# Patient Record
Sex: Female | Born: 1960 | Race: White | Hispanic: No | Marital: Married | Smoking: Never smoker
Health system: Southern US, Community
[De-identification: ages and names within clinical notes are randomized; demographics above are authoritative.]

## PROBLEM LIST (undated history)

## (undated) DIAGNOSIS — N763 Subacute and chronic vulvitis: Secondary | ICD-10-CM

## (undated) DIAGNOSIS — Z9889 Other specified postprocedural states: Secondary | ICD-10-CM

## (undated) DIAGNOSIS — E063 Autoimmune thyroiditis: Secondary | ICD-10-CM

## (undated) DIAGNOSIS — N6019 Diffuse cystic mastopathy of unspecified breast: Secondary | ICD-10-CM

## (undated) DIAGNOSIS — A630 Anogenital (venereal) warts: Secondary | ICD-10-CM

## (undated) DIAGNOSIS — D069 Carcinoma in situ of cervix, unspecified: Secondary | ICD-10-CM

## (undated) DIAGNOSIS — E039 Hypothyroidism, unspecified: Secondary | ICD-10-CM

## (undated) HISTORY — DX: Anogenital (venereal) warts: A63.0

## (undated) HISTORY — DX: Subacute and chronic vulvitis: N76.3

## (undated) HISTORY — DX: Diffuse cystic mastopathy of unspecified breast: N60.19

## (undated) HISTORY — DX: Autoimmune thyroiditis: E06.3

## (undated) HISTORY — DX: Carcinoma in situ of cervix, unspecified: D06.9

---
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
## 1990-08-17 DIAGNOSIS — N763 Subacute and chronic vulvitis: Secondary | ICD-10-CM

## 1990-08-17 HISTORY — DX: Subacute and chronic vulvitis: N76.3

## 1991-10-18 DIAGNOSIS — N6019 Diffuse cystic mastopathy of unspecified breast: Secondary | ICD-10-CM

## 1991-10-18 HISTORY — DX: Diffuse cystic mastopathy of unspecified breast: N60.19

## 1992-10-17 DIAGNOSIS — A630 Anogenital (venereal) warts: Secondary | ICD-10-CM

## 1992-10-17 HISTORY — DX: Anogenital (venereal) warts: A63.0

## 1994-11-17 HISTORY — PX: KNEE RECONSTRUCTION: SHX5883

## 1998-12-18 HISTORY — PX: COLPOSCOPY: SHX161

## 1999-01-11 ENCOUNTER — Other Ambulatory Visit: Admission: RE | Admit: 1999-01-11 | Discharge: 1999-01-11 | Payer: Self-pay | Admitting: *Deleted

## 1999-01-16 DIAGNOSIS — D069 Carcinoma in situ of cervix, unspecified: Secondary | ICD-10-CM

## 1999-01-16 HISTORY — PX: CERVICAL BIOPSY  W/ LOOP ELECTRODE EXCISION: SUR135

## 1999-01-16 HISTORY — DX: Carcinoma in situ of cervix, unspecified: D06.9

## 1999-01-30 ENCOUNTER — Other Ambulatory Visit: Admission: RE | Admit: 1999-01-30 | Discharge: 1999-01-30 | Payer: Self-pay | Admitting: *Deleted

## 1999-05-31 ENCOUNTER — Other Ambulatory Visit: Admission: RE | Admit: 1999-05-31 | Discharge: 1999-05-31 | Payer: Self-pay | Admitting: *Deleted

## 1999-05-31 ENCOUNTER — Encounter (INDEPENDENT_AMBULATORY_CARE_PROVIDER_SITE_OTHER): Payer: Self-pay

## 1999-09-18 ENCOUNTER — Other Ambulatory Visit: Admission: RE | Admit: 1999-09-18 | Discharge: 1999-09-18 | Payer: Self-pay | Admitting: *Deleted

## 2000-03-30 ENCOUNTER — Other Ambulatory Visit: Admission: RE | Admit: 2000-03-30 | Discharge: 2000-03-30 | Payer: Self-pay | Admitting: *Deleted

## 2000-09-04 ENCOUNTER — Other Ambulatory Visit: Admission: RE | Admit: 2000-09-04 | Discharge: 2000-09-04 | Payer: Self-pay | Admitting: *Deleted

## 2000-11-17 HISTORY — PX: SHOULDER ARTHROSCOPY: SHX128

## 2001-01-15 ENCOUNTER — Ambulatory Visit (HOSPITAL_BASED_OUTPATIENT_CLINIC_OR_DEPARTMENT_OTHER): Admission: RE | Admit: 2001-01-15 | Discharge: 2001-01-15 | Payer: Self-pay | Admitting: Orthopedic Surgery

## 2001-03-04 ENCOUNTER — Other Ambulatory Visit: Admission: RE | Admit: 2001-03-04 | Discharge: 2001-03-04 | Payer: Self-pay | Admitting: *Deleted

## 2001-09-06 ENCOUNTER — Other Ambulatory Visit: Admission: RE | Admit: 2001-09-06 | Discharge: 2001-09-06 | Payer: Self-pay | Admitting: *Deleted

## 2002-03-30 ENCOUNTER — Other Ambulatory Visit: Admission: RE | Admit: 2002-03-30 | Discharge: 2002-03-30 | Payer: Self-pay | Admitting: *Deleted

## 2002-10-03 ENCOUNTER — Other Ambulatory Visit: Admission: RE | Admit: 2002-10-03 | Discharge: 2002-10-03 | Payer: Self-pay | Admitting: *Deleted

## 2003-03-20 ENCOUNTER — Other Ambulatory Visit: Admission: RE | Admit: 2003-03-20 | Discharge: 2003-03-20 | Payer: Self-pay | Admitting: *Deleted

## 2003-10-05 ENCOUNTER — Other Ambulatory Visit: Admission: RE | Admit: 2003-10-05 | Discharge: 2003-10-05 | Payer: Self-pay | Admitting: *Deleted

## 2004-04-08 ENCOUNTER — Other Ambulatory Visit: Admission: RE | Admit: 2004-04-08 | Discharge: 2004-04-08 | Payer: Self-pay | Admitting: *Deleted

## 2004-09-19 ENCOUNTER — Other Ambulatory Visit: Admission: RE | Admit: 2004-09-19 | Discharge: 2004-09-19 | Payer: Self-pay | Admitting: *Deleted

## 2005-09-24 ENCOUNTER — Other Ambulatory Visit: Admission: RE | Admit: 2005-09-24 | Discharge: 2005-09-24 | Payer: Self-pay | Admitting: Obstetrics and Gynecology

## 2006-09-25 ENCOUNTER — Other Ambulatory Visit: Admission: RE | Admit: 2006-09-25 | Discharge: 2006-09-25 | Payer: Self-pay | Admitting: Obstetrics & Gynecology

## 2006-12-18 HISTORY — PX: KNEE ARTHROSCOPY: SHX127

## 2007-11-30 ENCOUNTER — Other Ambulatory Visit: Admission: RE | Admit: 2007-11-30 | Discharge: 2007-11-30 | Payer: Self-pay | Admitting: Obstetrics and Gynecology

## 2008-11-30 ENCOUNTER — Other Ambulatory Visit: Admission: RE | Admit: 2008-11-30 | Discharge: 2008-11-30 | Payer: Self-pay | Admitting: Obstetrics and Gynecology

## 2009-06-17 HISTORY — PX: REPAIR PERONEAL TENDONS ANKLE: SUR1201

## 2009-09-03 ENCOUNTER — Encounter: Admission: RE | Admit: 2009-09-03 | Discharge: 2009-09-03 | Payer: Self-pay | Admitting: Family Medicine

## 2009-10-02 ENCOUNTER — Encounter: Payer: Self-pay | Admitting: Sports Medicine

## 2009-10-16 ENCOUNTER — Ambulatory Visit: Payer: Self-pay | Admitting: Sports Medicine

## 2009-10-16 DIAGNOSIS — M7750 Other enthesopathy of unspecified foot: Secondary | ICD-10-CM | POA: Insufficient documentation

## 2009-10-16 DIAGNOSIS — M216X9 Other acquired deformities of unspecified foot: Secondary | ICD-10-CM

## 2009-10-16 DIAGNOSIS — M25579 Pain in unspecified ankle and joints of unspecified foot: Secondary | ICD-10-CM

## 2009-10-16 DIAGNOSIS — R269 Unspecified abnormalities of gait and mobility: Secondary | ICD-10-CM

## 2009-10-18 ENCOUNTER — Encounter: Payer: Self-pay | Admitting: Family Medicine

## 2010-05-17 HISTORY — PX: KNEE ARTHROSCOPY: SHX127

## 2010-06-11 IMAGING — CR DG SHOULDER 1V BILAT
2 series · 2 of 2 positions shown · non-contrast
Comparison: None

CLINICAL DATA: Research study.

BILATERAL SHOULDER - 1 VIEW

[view not recorded (1 of 2)]
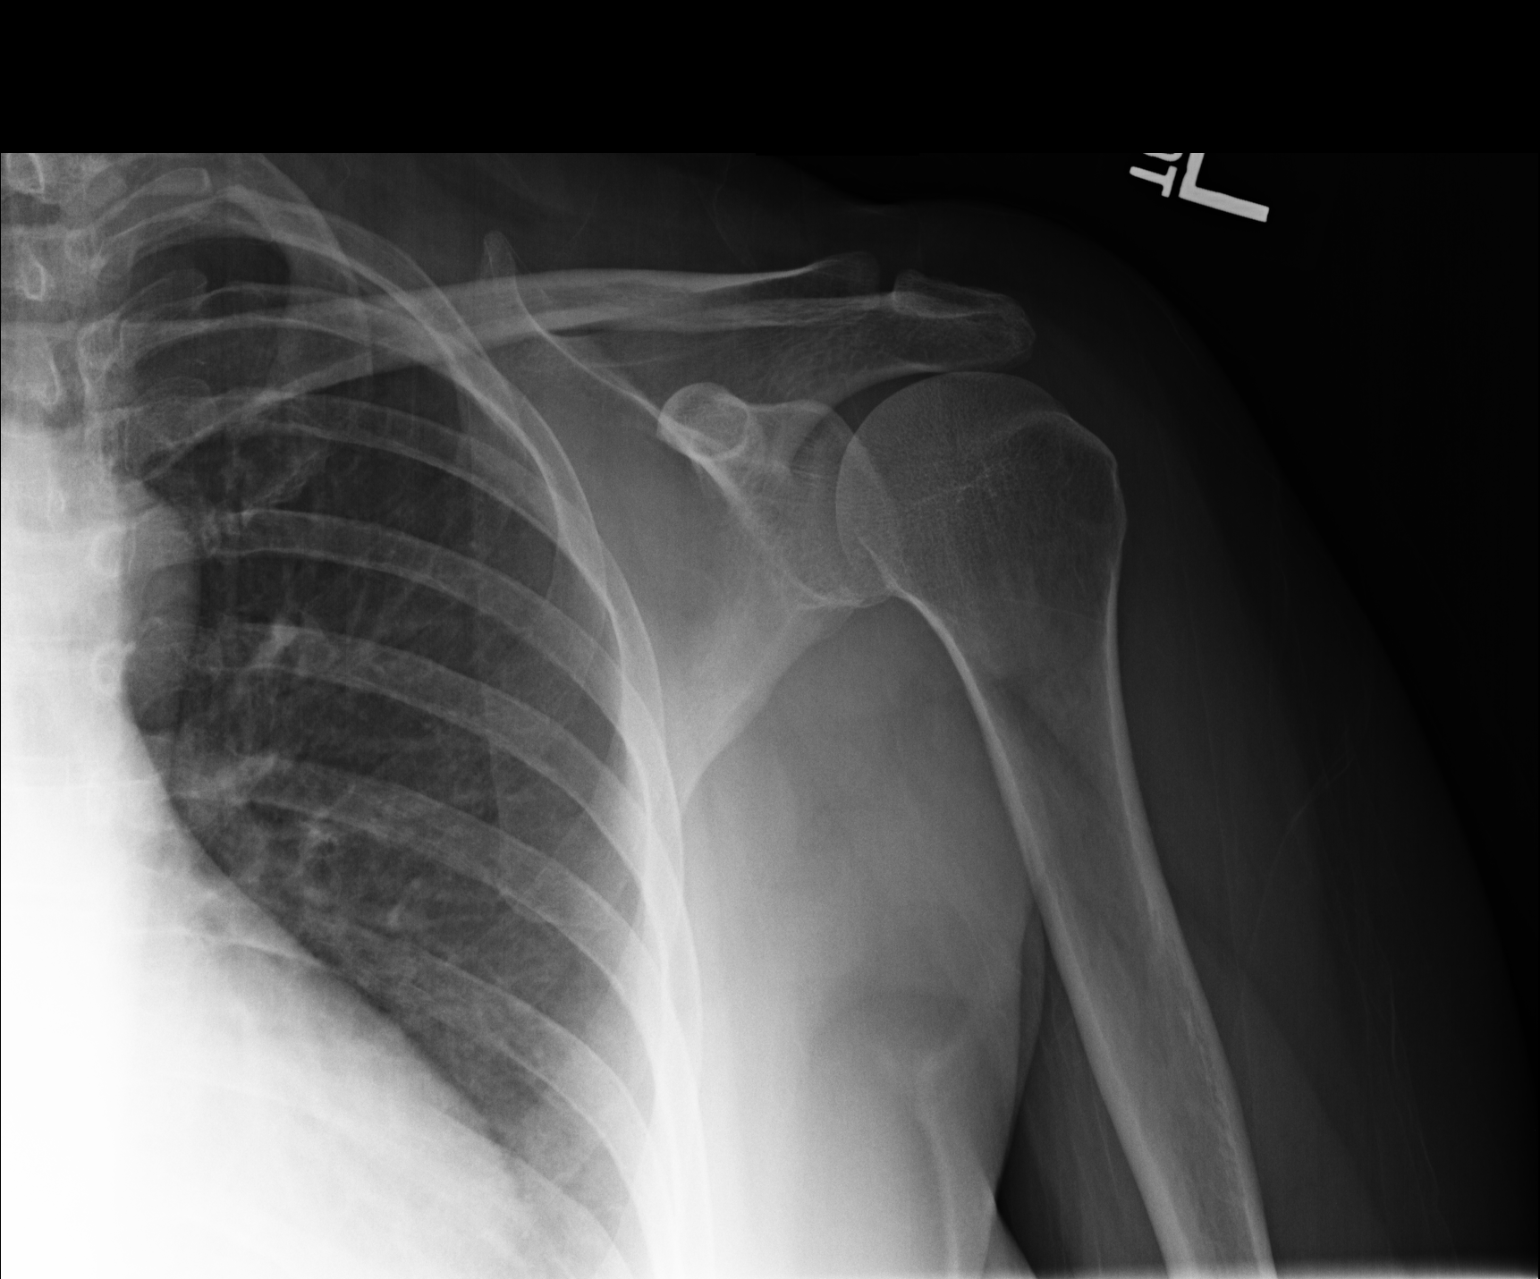

[view not recorded (2 of 2)]
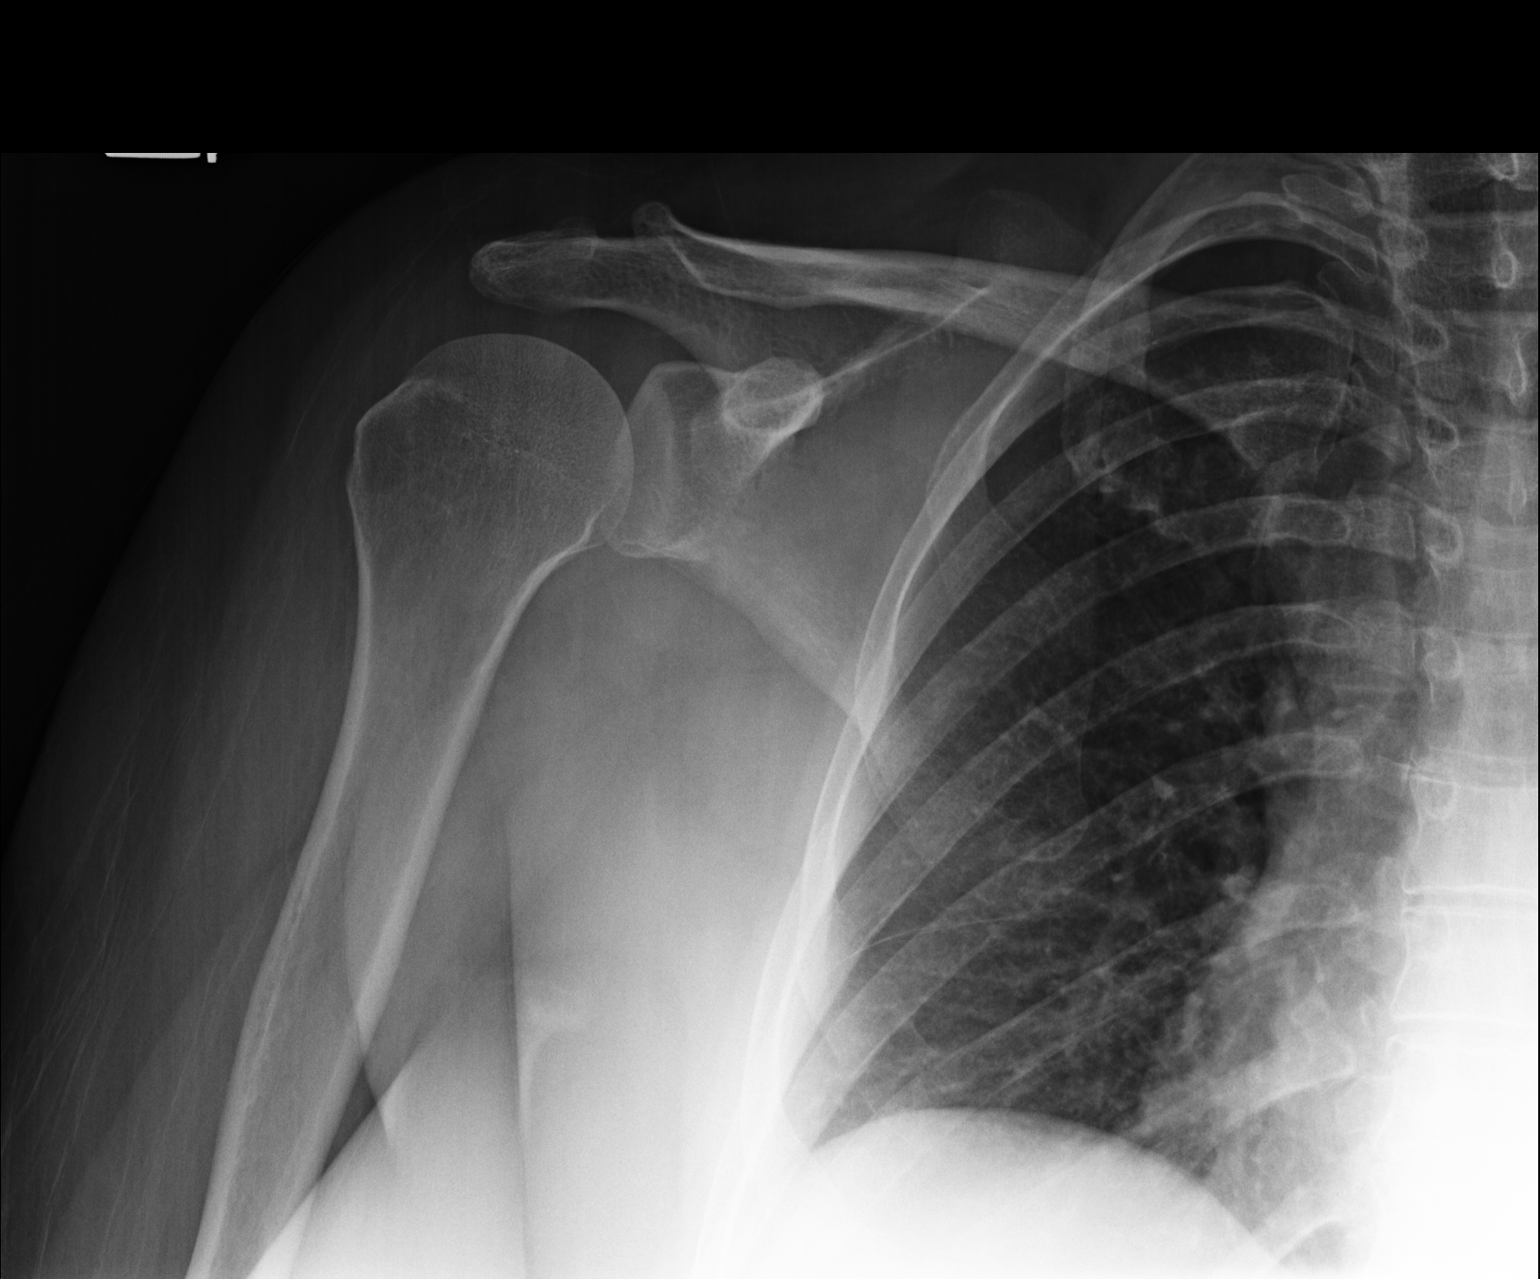

[2 of 2 positions shown; findings below may reference images not displayed]

FINDINGS: There is mild widening of the right AC joint relative to
the left.  Cannot completely exclude AC joint separation.
Recommend clinical correlation for injury and pain.  No fracture.
No significant degenerative changes.
IMPRESSION: Mild widening of the right AC joint relative to the left.
Recommend clinical correlation for possible AC joint separation.

## 2011-04-04 NOTE — Op Note (Signed)
[place]  Patient:    Yolanda Vaughn, Yolanda Vaughn                          MRN: [id number] Proc. Date: 01/15/01 Adm. Date:  [id number] Attending:  Colbert Ewing                           Operative Report  PREOPERATIVE DIAGNOSIS:  Chronic impingement, right shoulder.  POSTOPERATIVE DIAGNOSES: 1. Chronic impingement, right shoulder, with degenerative arthritis and    impingement at distal clavicle. 2. Abrasive tearing, superior rotator cuff.  PROCEDURES: 1. Right shoulder examination under anesthesia, arthroscopy, debridement of    rotator cuff. 2. Acromioplasty. 3. Coracoacromial ligament release. 4. Excision distal clavicle.  SURGEON:  Loreta Ave, M.D.  ASSISTANT:  Arlys John D. Petrarca, P.A.-C.  ANESTHESIA:  General.  ESTIMATED BLOOD LOSS:  Minimal.  SPECIMENS:  None.  CULTURES:  None.  COMPLICATIONS:  None.  DRESSING:  Soft compressive with sling.  DESCRIPTION OF PROCEDURE:  Patient brought into the operating room and after adequate anesthesia had been obtained, right shoulder examined.  Full motion, good stability.  Placed in a beach chair position on the shoulder positioner, prepped and draped in the usual sterile fashion.  Three standard arthroscopic portals, anterior, posterior, lateral.  Shoulder entered with a blunt obturator, distended, and inspected.  The glenohumeral joint itself looked excellent without any evidence of instability.  Labrum, articular cartilage, biceps tendon, biceps anchor, rotator cuff all intact.  No evidence of internal impingement.  Cannula redirected subacromially.  Picture consistent with chronic impingement with abrasive tearing of the top of the cuff.  Type 2 acromion with marked anterior narrowing.  Bursa resected, cuff debrided. Acromion converted to a type 1 acromion with shaver and high-speed bur. Distal clavicle was contributing to impingement before acromioplasty.  Bottom half of the  clavicle had grade 3 and 4 changes.  The top half had grade 2 and 3 changes.  Lateral centimeter therefore sharply resected.  Margins of the acromion at the [facility] joint also trimmed to a stable surface.  Decompression acromioplasty, clavicle excision viewed from all portals and was adequate. Cuff thoroughly inspected after being debrided.  No full-thickness tears. Instruments and fluid removed.  Portals, shoulder, and bursa injected with Marcaine.  Portals closed with 4-0 nylon.  Sterile compressive dressing applied.  Sling applied.  The anesthesia reversed, brought to recovery room. Tolerated the surgery well with no complications. DD:  01/15/01 TD:  01/16/01 Job: [id number]

## 2012-01-27 DIAGNOSIS — M179 Osteoarthritis of knee, unspecified: Secondary | ICD-10-CM | POA: Insufficient documentation

## 2013-08-17 LAB — HM COLONOSCOPY: HM Colonoscopy: NORMAL

## 2013-11-17 DIAGNOSIS — E063 Autoimmune thyroiditis: Secondary | ICD-10-CM

## 2013-11-17 HISTORY — DX: Autoimmune thyroiditis: E06.3

## 2014-01-06 ENCOUNTER — Encounter: Payer: Self-pay | Admitting: Nurse Practitioner

## 2014-01-09 ENCOUNTER — Ambulatory Visit (INDEPENDENT_AMBULATORY_CARE_PROVIDER_SITE_OTHER): Payer: Managed Care, Other (non HMO) | Admitting: Nurse Practitioner

## 2014-01-09 ENCOUNTER — Encounter: Payer: Self-pay | Admitting: Nurse Practitioner

## 2014-01-09 VITALS — BP 116/72 | HR 64 | Ht 66.5 in | Wt 255.0 lb

## 2014-01-09 DIAGNOSIS — Z01419 Encounter for gynecological examination (general) (routine) without abnormal findings: Secondary | ICD-10-CM

## 2014-01-09 DIAGNOSIS — I10 Essential (primary) hypertension: Secondary | ICD-10-CM

## 2014-01-09 DIAGNOSIS — E039 Hypothyroidism, unspecified: Secondary | ICD-10-CM | POA: Insufficient documentation

## 2014-01-09 DIAGNOSIS — Z0000 Encounter for general adult medical examination without abnormal findings: Secondary | ICD-10-CM

## 2014-01-09 DIAGNOSIS — Z8742 Personal history of other diseases of the female genital tract: Secondary | ICD-10-CM

## 2014-01-09 DIAGNOSIS — E669 Obesity, unspecified: Secondary | ICD-10-CM

## 2014-01-09 LAB — POCT URINALYSIS DIPSTICK
BILIRUBIN UA: NEGATIVE
Blood, UA: NEGATIVE
Glucose, UA: NEGATIVE
KETONES UA: NEGATIVE
LEUKOCYTES UA: NEGATIVE
Nitrite, UA: NEGATIVE
PH UA: 5
Protein, UA: NEGATIVE
UROBILINOGEN UA: NEGATIVE

## 2014-01-09 LAB — HEMOGLOBIN, FINGERSTICK: Hemoglobin, fingerstick: 13.4 g/dL (ref 12.0–16.0)

## 2014-01-09 MED ORDER — ESTRADIOL 10 MCG VA TABS: 1.0000 | ORAL_TABLET | VAGINAL | Status: DC

## 2014-01-09 MED ORDER — NORETHINDRONE 0.35 MG PO TABS: 1.0000 | ORAL_TABLET | Freq: Every day | ORAL | Status: DC

## 2014-01-09 NOTE — Progress Notes (Signed)
Patient ID: Yolanda Vaughn, female   DOB: [birth year], 53 y.o.   MRN: [id number] 53 y.o. G0P0 Married Caucasian Fe here for annual exam.  Has amenorrhea since mid October. No PMS symptoms. No spotting. Some increase in vaso symptoms. At times vaginal dryness is better with Vagifem.   Patient's last menstrual period was 09/05/2013.          Sexually active: yes  The current method of family planning is oral progesterone-only contraceptive.    Exercising: yes  bike and weights Smoker:  no  Health Maintenance: Pap:  01/06/13, WNL, neg HR HPV MMG:  2013  Colonoscopy:  08/2013, normal, repeat in 10 years BMD:   never TDaP:  ? Labs: HB:  13.4  (routine labs at PCP in 07/2013) Urine:  Negative, pH 5.0   reports that she has never smoked. She has never used smokeless tobacco. She reports that she does not drink alcohol or use illicit drugs.  Past Medical History  Diagnosis Date  . Fibrocystic breast 10/1991  . Chronic vulvitis 08/1990  . 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 Knee reconstruction Right 1996  . Shoulder arthroscopy Right 2002  . Knee arthroscopy Right 12/2006  . Repair peroneal tendons ankle Left 06/2009  . Knee arthroscopy Right 05/2010  . Tonsillectomy and adenoidectomy  1968  . Cervical biopsy  w/ loop electrode excision  01/1999  . Colposcopy  12/1998    Current Outpatient Prescriptions  Medication Sig Dispense Refill  . albuterol (PROVENTIL HFA;VENTOLIN HFA) 108 (90 BASE) MCG/ACT inhaler Inhale 2 puffs into the lungs every 6 (six) hours as needed for wheezing or shortness of breath.      . Calcium Carbonate-Vit D-Min (CALCIUM 1200 PO) Take by mouth daily.      . Cholecalciferol (VITAMIN D) 2000 UNITS tablet Take 5,000 Units by mouth daily.       Marland Kitchen. Dexlansoprazole 30 MG capsule Take 30 mg by mouth daily.      . Estradiol (VAGIFEM)  10 MCG TABS vaginal tablet Place 1 tablet (10 mcg total) vaginally every 3 (three) months.  24 tablet  3  . fexofenadine (ALLEGRA) 180 MG tablet Take 180 mg by mouth daily.      . fluticasone (FLONASE) 50 MCG/ACT nasal spray as directed.      Marland Kitchen. levothyroxine (SYNTHROID, LEVOTHROID) 50 MCG tablet Take 100 mcg by mouth daily before breakfast.       . meloxicam (MOBIC) 15 MG tablet Take 15 mg by mouth daily.      . mometasone-formoterol (DULERA) 100-5 MCG/ACT AERO Inhale 2 puffs into the lungs 2 (two) times daily.      . norethindrone (MICRONOR,CAMILA,ERRIN) 0.35 MG tablet Take 1 tablet (0.35 mg total) by mouth daily.  3 Package  3  . pseudoephedrine (SUDAFED) 30 MG tablet Take 30 mg by mouth every 4 (four) hours as needed for congestion.      Marland Kitchen. QVAR 80 MCG/ACT inhaler as needed.      . RELPAX 40 MG tablet Take 1 tablet by mouth as needed. migraine       No current facility-administered medications for this visit.    Family History  Problem Relation Age of Onset  . Osteoporosis Mother   . Fibroids Mother   . Hypertension Mother   . Colitis Mother   . Hyperlipidemia Mother   . Thyroid disease  Mother   . Fibroids Sister   . Osteoporosis Maternal Grandmother   . Emphysema Maternal Grandfather   . Pulmonary disease Maternal Grandfather   . Dementia Father   . Thyroid disease Brother   . Hyperlipidemia Brother     ROS:  Pertinent items are noted in HPI.  Otherwise, a comprehensive ROS was negative.  Exam:   BP 116/72  Pulse 64  Ht 5' 6.5" (1.689 m)  Wt 255 lb (115.667 kg)  BMI 40.55 kg/m2  LMP 09/05/2013 Height: 5' 6.5" (168.9 cm)  Ht Readings from Last 3 Encounters:  01/09/14 5' 6.5" (1.689 m)    General appearance: alert, cooperative and appears stated age Head: Normocephalic, without obvious abnormality, atraumatic Neck: no adenopathy, supple, symmetrical, trachea midline and thyroid normal to inspection and palpation Lungs: clear to auscultation bilaterally Breasts: normal  appearance, no masses or tenderness Heart: regular rate and rhythm Abdomen: soft, non-tender; no masses,  no organomegaly Extremities: extremities normal, atraumatic, no cyanosis or edema Skin: Skin color, texture, turgor normal. No rashes or lesions Lymph nodes: Cervical, supraclavicular, and axillary nodes normal. No abnormal inguinal nodes palpated Neurologic: Grossly normal   Pelvic: External genitalia:  no lesions              Urethra:  normal appearing urethra with no masses, tenderness or lesions              Bartholin's and Skene's: normal                 Vagina: normal appearing vagina with normal color and discharge, no lesions              Cervix: anteverted              Pap taken: yes Bimanual Exam:  Uterus:  normal size, contour, position, consistency, mobility, non-tender              Adnexa: no mass, fullness, tenderness               Rectovaginal: Confirms               Anus:  normal sphincter tone, no lesions  A:  Well Woman with normal exam  POP for contraception since 12/2010, at risk for endo hyperplasia  Hypertension, obesity,  hypothyroid  History of CIN III 01/1999  P:   Pap smear as per guidelines Done today  Mammogram is due now and will schedule  Refill POP for a year  Refill Vagifem for a year - uses occasionally  Counseled on breast self exam, mammography screening, adequate intake of calcium and vitamin D, diet and exercise return annually or prn  An After Visit Summary was printed and given to the patient.

## 2014-01-09 NOTE — Patient Instructions (Signed)

## 2014-01-10 ENCOUNTER — Telehealth: Payer: Self-pay | Admitting: Nurse Practitioner

## 2014-01-10 NOTE — Telephone Encounter (Signed)
[facility] calling to verify the directions for vagifem

## 2014-01-11 LAB — IPS PAP TEST WITH REFLEX TO HPV

## 2014-01-12 NOTE — Progress Notes (Signed)
Encounter reviewed by Dr. Maurianna Benard Silva.  

## 2014-01-13 NOTE — Telephone Encounter (Signed)
Spoke with Dr. Farrel GobbleLathrop, directions are 1 tablet PV at bedtime two times per week. Spoke with pharmacist, Arm and confirmed order.

## 2014-05-16 DIAGNOSIS — M7742 Metatarsalgia, left foot: Secondary | ICD-10-CM | POA: Insufficient documentation

## 2014-05-16 DIAGNOSIS — M79672 Pain in left foot: Secondary | ICD-10-CM | POA: Insufficient documentation

## 2015-01-11 ENCOUNTER — Encounter: Payer: Self-pay | Admitting: Nurse Practitioner

## 2015-01-11 ENCOUNTER — Ambulatory Visit (INDEPENDENT_AMBULATORY_CARE_PROVIDER_SITE_OTHER): Payer: Managed Care, Other (non HMO) | Admitting: Nurse Practitioner

## 2015-01-11 VITALS — BP 110/72 | HR 60 | Ht 66.5 in | Wt 221.0 lb

## 2015-01-11 DIAGNOSIS — Z0000 Encounter for general adult medical examination without abnormal findings: Secondary | ICD-10-CM

## 2015-01-11 DIAGNOSIS — Z01419 Encounter for gynecological examination (general) (routine) without abnormal findings: Secondary | ICD-10-CM

## 2015-01-11 LAB — POCT URINALYSIS DIPSTICK
BILIRUBIN UA: NEGATIVE
GLUCOSE UA: NEGATIVE
KETONES UA: NEGATIVE
Leukocytes, UA: NEGATIVE
NITRITE UA: NEGATIVE
PH UA: 5
Protein, UA: NEGATIVE
RBC UA: NEGATIVE
Urobilinogen, UA: NEGATIVE

## 2015-01-11 NOTE — Patient Instructions (Signed)

## 2015-01-11 NOTE — Progress Notes (Signed)
Patient ID: Yolanda Vaughn, female   DOB: 1961/05/20, 54 y.o.   MRN: [id number] 54 y.o. G0P0 Married  Caucasian Fe here for annual exam.  Now diagnosed with Hashimoto in 7/15.  [facility] discontinued Micronor because it interferes with thyroid and started her on Prometrium 100 mg  2 a day. Now menses every 3 months, lasting 3-4 days.  Very light flow, slight cramps, no PMS.  Also on Vivelle dot 0.0375 twice a week.  Gets a compounded thyroid dose of (T4 at 58 mcg & T3 at 18 mcg).  Since being on this has lost 30 lbs since last July.  Patient's last menstrual period was 01/07/2015 (exact date).      Sexually active: Yes.    The current method of family planning is condoms all of the time.    Exercising: Yes.    walking Smoker:  no  Health Maintenance: Pap:  01/09/14, negative (history of CIN-III with LEEP 01/1999) MMG:  03/27/14, Bi-Rads 1:  Negative  Colonoscopy:  08/2013, normal, repeat in 10 years BMD:   2014, normal per patient TDaP: current Labs:  HB:  PCP  Urine:  Negative    reports that she has never smoked. She has never used smokeless tobacco. She reports that she does not drink alcohol or use illicit drugs.  Past Medical History  Diagnosis Date  . Fibrocystic breast 10/1991  . Chronic vulvitis 08/1990  . 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 Hashimoto's disease 2015    Past Surgical History  Procedure Laterality Date  . Knee reconstruction Right 1996  . Shoulder arthroscopy Right 2002  . Knee arthroscopy Right 12/2006  . Repair peroneal tendons ankle Left 06/2009  . Knee arthroscopy Right 05/2010  . Tonsillectomy and adenoidectomy  1968  . Cervical biopsy  w/ loop electrode excision  01/1999  . Colposcopy  12/1998    Current Outpatient Prescriptions  Medication Sig Dispense Refill  . albuterol (PROVENTIL HFA;VENTOLIN HFA) 108 (90 BASE) MCG/ACT inhaler Inhale  2 puffs into the lungs every 6 (six) hours as needed for wheezing or shortness of breath.    . estradiol (VIVELLE-DOT) 0.0375 MG/24HR Place 1 patch onto the skin 2 (two) times a week.  4  . fexofenadine (ALLEGRA) 180 MG tablet Take 180 mg by mouth daily.    . fluticasone (FLONASE) 50 MCG/ACT nasal spray as directed.    . mometasone-formoterol (DULERA) 100-5 MCG/ACT AERO Inhale 2 puffs into the lungs 2 (two) times daily.    . progesterone (PROMETRIUM) 100 MG capsule Take 2 capsules by mouth daily.  3   No current facility-administered medications for this visit.    Family History  Problem Relation Age of Onset  . Osteoporosis Mother   . Fibroids Mother   . Hypertension Mother   . Colitis Mother   . Hyperlipidemia Mother   . Thyroid disease Mother   . COPD Mother   . Heart failure Mother   . Fibroids Sister   . Osteoporosis Maternal Grandmother   . Emphysema Maternal Grandfather   . Pulmonary disease Maternal Grandfather   . Dementia Father   . Thyroid disease Brother   . Hyperlipidemia Brother     ROS:  Pertinent items are noted in HPI.  Otherwise, a comprehensive ROS was negative.  Exam:   BP 110/72 mmHg  Pulse 60  Ht 5' 6.5" (1.689 m)  Wt 221 lb (100.245 kg)  BMI 35.14 kg/m2  LMP 01/07/2015 (Exact Date) Height: 5' 6.5" (168.9 cm) Ht Readings from Last 3 Encounters:  01/11/15 5' 6.5" (1.689 m)  01/09/14 5' 6.5" (1.689 m)    General appearance: alert, cooperative and appears stated age Head: Normocephalic, without obvious abnormality, atraumatic Neck: no adenopathy, supple, symmetrical, trachea midline and thyroid normal to inspection and palpation Lungs: clear to auscultation bilaterally Breasts: normal appearance, no masses or tenderness Heart: regular rate and rhythm Abdomen: soft, non-tender; no masses,  no organomegaly Extremities: extremities normal, atraumatic, no cyanosis or edema Skin: Skin color, texture, turgor normal. No rashes or lesions Lymph nodes:  Cervical, supraclavicular, and axillary nodes normal. No abnormal inguinal nodes palpated Neurologic: Grossly normal   Pelvic: External genitalia:  no lesions              Urethra:  normal appearing urethra with no masses, tenderness or lesions              Bartholin's and Skene's: normal                 Vagina: normal appearing vagina with normal color and discharge, no lesions              Cervix: anteverted              Pap taken: Yes.   Bimanual Exam:  Uterus:  normal size, contour, position, consistency, mobility, non-tender              Adnexa: no mass, fullness, tenderness               Rectovaginal: Confirms               Anus:  normal sphincter tone, no lesions  Chaperone present:  yes  A:  Well Woman with normal exam  POP for contraception since 12/2010 - 7/ 2015, at risk for endo hyperplasia  Now on Bio identical HRT at [facility] Hypertension, obesity, hypothyroid History of CIN III 01/1999 (pap for 20 years)  P:   Reviewed health and wellness pertinent to exam  Pap smear taken today  Mammogram is due 5/16  She will continue with follow up at [facility]  Counseled on breast self exam, mammography screening, use and side effects of HRT, adequate intake of calcium and vitamin D, diet and exercise return annually or prn  An After Visit Summary was printed and given to the patient.

## 2015-01-13 NOTE — Progress Notes (Signed)
Encounter reviewed by Dr. Brook Silva.  

## 2015-01-15 LAB — IPS PAP TEST WITH HPV

## 2015-10-16 ENCOUNTER — Encounter: Payer: Self-pay | Admitting: Allergy and Immunology

## 2015-10-16 ENCOUNTER — Ambulatory Visit (INDEPENDENT_AMBULATORY_CARE_PROVIDER_SITE_OTHER): Payer: Commercial Managed Care - HMO | Admitting: Allergy and Immunology

## 2015-10-16 VITALS — BP 112/82 | HR 72 | Resp 16

## 2015-10-16 DIAGNOSIS — J309 Allergic rhinitis, unspecified: Secondary | ICD-10-CM | POA: Diagnosis not present

## 2015-10-16 DIAGNOSIS — H1010 Acute atopic conjunctivitis, unspecified eye: Secondary | ICD-10-CM | POA: Diagnosis not present

## 2015-10-16 DIAGNOSIS — J4540 Moderate persistent asthma, uncomplicated: Secondary | ICD-10-CM | POA: Diagnosis not present

## 2015-10-16 MED ORDER — FLUTICASONE PROPIONATE 50 MCG/ACT NA SUSP: NASAL | Status: DC

## 2015-10-16 MED ORDER — MOMETASONE FURO-FORMOTEROL FUM 200-5 MCG/ACT IN AERO: INHALATION_SPRAY | RESPIRATORY_TRACT | Status: DC

## 2015-10-16 MED ORDER — ALBUTEROL SULFATE HFA 108 (90 BASE) MCG/ACT IN AERS: INHALATION_SPRAY | RESPIRATORY_TRACT | Status: DC

## 2015-10-16 MED ORDER — MOMETASONE FURO-FORMOTEROL FUM 100-5 MCG/ACT IN AERO: INHALATION_SPRAY | RESPIRATORY_TRACT | Status: DC

## 2015-10-16 NOTE — Patient Instructions (Signed)
  1. Continue Dulera 200 2 inhalations 1 or 2 times per day depending on asthma activity  2. Continue Flonase one spray each nostril one or 2 times per day   3. Continue ProAir HFA 2 puffs every 4-6 hours if needed  4. Continue OTC antihistamine if needed  5. Return in one year or earlier if problem

## 2015-10-16 NOTE — Progress Notes (Signed)
[facility]  Follow-up Note  Refering Provider: Laurann Montana, MD Primary Provider: Cala Bradford, MD  Subjective:   Yolanda Vaughn is a 54 y.o. female who [facility] the [facility] in re-evaluation of the following:  HPI Comments:  Yolanda Vaughn returns to this clinic on 10/16/2015 in reevaluation of her asthma and allergic rhinoconjunctivitis. It is been approximately 2 years since I've seen her in this clinic and she states that she has been doing very well while consistently using her Dulera 202 inhalations only one time per day at this point. She rarely uses any short acting bronchodilator and she has not had any significant flares of her asthma requiring her to get a systemic steroid. She continues on Flonase every day which is resulting in very good control of her nasal symptoms and she no longer requires any additional antihistamines. She has lost 50 pounds weight in the past year once her Hashimoto's thyroiditis was addressed and she changed her diet to basically a vegetarian diet.   Outpatient Encounter Prescriptions as of 10/16/2015  Medication Sig  . albuterol (PROAIR HFA) 108 (90 BASE) MCG/ACT inhaler Inhale two puffs every four to six hours as needed for cough or wheeze.  Can use two puffs 15 minutes prior to exercise if needed.  . Cholecalciferol (VITAMIN D PO) Take 5,000 Units by mouth 2 (two) times daily.  Marland Kitchen DHEA 25 MG CAPS Take 1 capsule by mouth 2 (two) times a week.  . estradiol (VIVELLE-DOT) 0.05 MG/24HR patch   . fexofenadine (ALLEGRA) 180 MG tablet Take 180 mg by mouth daily.  . fluticasone (FLONASE) 50 MCG/ACT nasal spray Use one spray in each nostril twice daily to prevent runny nose and congestion.  . Ginger, Zingiber officinalis, (GINGER ROOT) 550 MG CAPS Take by mouth.  . IRON PO Take 18 mg by mouth.  . Methylcobalamin (METHYL B-12 PO) Take 1,000 mcg by mouth 2 (two) times a week.  .  mometasone-formoterol (DULERA) 200-5 MCG/ACT AERO Inhale 2 puffs into the lungs 2 (two) times daily. Rinse, gargle, and spit after use.  . Multiple Vitamins-Minerals (ZINC PO) Take 50 mg by mouth. Solaray Biocitrate Zince.  . Nutritional Supplements (NUTRITIONAL SUPPLEMENT PO) Take 400 mg by mouth. Bio-Curumin  . Omega-3 Fatty Acids (OMEGA 3 PO) Take by mouth.  . Pregnenolone POWD 75 mg by Does not apply route.  Marland Kitchen PRESCRIPTION MEDICATION daily. T4- AND T3- 18 mcg. Compound medication.  Marland Kitchen PRESCRIPTION MEDICATION 1 application 2 (two) times a week. Testosterone 2% Cream.  . Probiotic Product (PROBIOTIC DAILY PO) Take by mouth daily.  . progesterone (PROMETRIUM) 100 MG capsule Take 2 capsules by mouth daily.  . vitamin A 81191 UNIT capsule Take 25,000 Units by mouth.  Marland Kitchen VITAMIN K PO Take 150 mcg by mouth. Vitamin K2 MK-7  . [DISCONTINUED] mometasone-formoterol (DULERA) 100-5 MCG/ACT AERO Inhale two puffs twice daily to prevent cough or wheeze.  Rinse, gargle, and spit after use.  . albuterol (PROVENTIL HFA;VENTOLIN HFA) 108 (90 BASE) MCG/ACT inhaler Inhale 2 puffs into the lungs every 6 (six) hours as needed for wheezing or shortness of breath.  . estradiol (VIVELLE-DOT) 0.0375 MG/24HR Place 1 patch onto the skin 2 (two) times a week.  . fluorometholone (FML) 0.1 % ophthalmic suspension   . [DISCONTINUED] fluticasone (FLONASE) 50 MCG/ACT nasal spray as directed.  . [DISCONTINUED] mometasone-formoterol (DULERA) 100-5 MCG/ACT AERO Inhale 2 puffs into the lungs 2 (two) times daily.  No facility-administered encounter medications on file as of 10/16/2015.    Meds ordered this encounter  Medications  . DISCONTD: mometasone-formoterol (DULERA) 100-5 MCG/ACT AERO    Sig: Inhale two puffs twice daily to prevent cough or wheeze.  Rinse, gargle, and spit after use.    Dispense:  3 Inhaler    Refill:  1  . fluticasone (FLONASE) 50 MCG/ACT nasal spray    Sig: Use one spray in each nostril twice  daily to prevent runny nose and congestion.    Dispense:  48 g    Refill:  1  . albuterol (PROAIR HFA) 108 (90 BASE) MCG/ACT inhaler    Sig: Inhale two puffs every four to six hours as needed for cough or wheeze.  Can use two puffs 15 minutes prior to exercise if needed.    Dispense:  3 Inhaler    Refill:  0    Past Medical History  Diagnosis Date  . Fibrocystic breast 10/1991  . Chronic vulvitis 08/1990  . 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 Hashimoto's disease 2015    Past Surgical History  Procedure Laterality Date  . Knee reconstruction Right 1996  . Shoulder arthroscopy Right 2002  . Knee arthroscopy Right 12/2006  . Repair peroneal tendons ankle Left 06/2009  . Knee arthroscopy Right 05/2010  . Tonsillectomy and adenoidectomy  1968  . Cervical biopsy  w/ loop electrode excision  01/1999  . Colposcopy  12/1998    Allergies  Allergen Reactions  . Codeine Hives  . Papaya Derivatives Hives  . Plum Pulp Hives  . Sulfamethoxazole Other (See Comments)    Hives  . Sulfonamide Derivatives Rash  . Talwin [Pentazocine] Palpitations    Review of Systems  Constitutional: Negative.   HENT: Negative.   Eyes: Negative.   Respiratory: Negative.   Cardiovascular: Negative.   Gastrointestinal: Negative.   Musculoskeletal: Negative.   Skin: Negative.   Neurological: Negative for headaches.  Hematological: Negative.      Objective:   Filed Vitals:   10/16/15 1152  BP: 112/82  Pulse: 72  Resp: 16          Physical Exam  Constitutional: She appears well-developed and well-nourished. No distress.  HENT:  Head: Normocephalic and atraumatic. Head is without right periorbital erythema and without left periorbital erythema.  Right Ear: Tympanic membrane, external ear and ear canal normal. No drainage or tenderness. No foreign bodies. Tympanic membrane is not injected, not scarred,  not perforated, not erythematous, not retracted and not bulging. No middle ear effusion.  Left Ear: Tympanic membrane, external ear and ear canal normal. No drainage or tenderness. No foreign bodies. Tympanic membrane is not injected, not scarred, not perforated, not erythematous, not retracted and not bulging.  No middle ear effusion.  Nose: Nose normal. No mucosal edema, rhinorrhea, nose lacerations or sinus tenderness.  No foreign bodies.  Mouth/Throat: Oropharynx is clear and moist. No oropharyngeal exudate, posterior oropharyngeal edema, posterior oropharyngeal erythema or tonsillar abscesses.  Eyes: Lids are normal. Right eye exhibits no chemosis, no discharge and no exudate. No foreign body present in the right eye. Left eye exhibits no chemosis, no discharge and no exudate. No foreign body present in the left eye. Right conjunctiva is not injected. Left conjunctiva is not injected.  Neck: Neck supple. No tracheal tenderness present. No tracheal deviation and no edema present. No thyroid mass and no thyromegaly present.  Cardiovascular: Normal rate,  regular rhythm, S1 normal and S2 normal.  Exam reveals no gallop.   No murmur heard. Pulmonary/Chest: No accessory muscle usage or stridor. No respiratory distress. She has no wheezes. She has no rhonchi. She has no rales.  Abdominal: Soft.  Lymphadenopathy:       Head (right side): No tonsillar adenopathy present.       Head (left side): No tonsillar adenopathy present.    She has no cervical adenopathy.  Neurological: She is alert.  Skin: No rash noted. She is not diaphoretic.  Psychiatric: She has a normal mood and affect. Her behavior is normal.    Diagnostics:    Spirometry was performed and demonstrated an FEV1 of 2.80 at 101 % of predicted.  The patient had an Asthma Control Test with the following results:  .    Assessment and Plan:   1. Moderate persistent asthma, uncomplicated   2. Allergic rhinoconjunctivitis      1.  Continue Dulera 200 2 inhalations 1 or 2 times per day depending on asthma activity  2. Continue Flonase one spray each nostril one or 2 times per day   3. Continue ProAir HFA 2 puffs every 4-6 hours if needed  4. Continue OTC antihistamine if needed  5. Return in one year or earlier if problem  Tresa EndoKelly is done quite well and I refilled her medications and I will see her back in this clinic in 1 year or earlier if there is a problem. She appears to have a very good understanding of her disease state and how the medications work and when it is appropriate to use specific medications. I did have a talk with her today about getting a flu vaccine but she refuses to do so because of all the chemicals contained within the flu vaccine. I did have a talk with her today about recognizing influenza should she ever contracted and the need to get Tamiflu early.   Laurette SchimkeEric Kozlow, MD [facility]

## 2016-01-15 ENCOUNTER — Ambulatory Visit: Payer: Managed Care, Other (non HMO) | Admitting: Nurse Practitioner

## 2016-01-22 ENCOUNTER — Ambulatory Visit (INDEPENDENT_AMBULATORY_CARE_PROVIDER_SITE_OTHER): Payer: Managed Care, Other (non HMO) | Admitting: Nurse Practitioner

## 2016-01-22 ENCOUNTER — Encounter: Payer: Self-pay | Admitting: Nurse Practitioner

## 2016-01-22 ENCOUNTER — Telehealth: Payer: Self-pay | Admitting: Nurse Practitioner

## 2016-01-22 VITALS — BP 110/60 | HR 64 | Ht 66.0 in | Wt 212.0 lb

## 2016-01-22 DIAGNOSIS — Z0000 Encounter for general adult medical examination without abnormal findings: Secondary | ICD-10-CM | POA: Diagnosis not present

## 2016-01-22 DIAGNOSIS — Z01419 Encounter for gynecological examination (general) (routine) without abnormal findings: Secondary | ICD-10-CM | POA: Diagnosis not present

## 2016-01-22 LAB — POCT URINALYSIS DIPSTICK
BILIRUBIN UA: NEGATIVE
Blood, UA: NEGATIVE
Glucose, UA: NEGATIVE
KETONES UA: NEGATIVE
LEUKOCYTES UA: NEGATIVE
Nitrite, UA: NEGATIVE
PH UA: 7
Protein, UA: NEGATIVE
Urobilinogen, UA: NEGATIVE

## 2016-01-22 NOTE — Progress Notes (Signed)
Patient ID: Yolanda Vaughn, female   DOB: Apr 30, 1961, 55 y.o.   MRN: [id number]  55 y.o. G0P0000 Married  Caucasian Fe here for annual exam.  On thyroid and HRT with [facility] and followed there every 3-6 months.  Fatigue is much better, healthy lifestyle, gluten free and vegan diet.  Walking a lot with job with a lot of stretching.  No vaginal bleeding since 02/2015.  No vaso symptoms, no vaginal dryness.   Patient's last menstrual period was 02/16/2015 (approximate).          Sexually active: Yes.    The current method of family planning is condoms.    Exercising: Yes.    walking Smoker:  no  Health Maintenance: Pap:01/11/15, Negative with neg HR HPV (history of CIN-III with LEEP 01/1999) MMG: 05/09/15, Bi-Rads 1: Negative  Colonoscopy: 08/2013, normal, repeat in 10 years BMD: 2014, normal per patient TDaP: current Hep C: will do today Labs: [facility] takes care of labs  Urine: negative   reports that she has never smoked. She has never used smokeless tobacco. She reports that she does not drink alcohol or use illicit drugs.  Past Medical History  Diagnosis Date  . Fibrocystic breast 10/1991  . Chronic vulvitis 08/1990  . 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 Hashimoto's disease 2015    Past Surgical History  Procedure Laterality Date  . Knee reconstruction Right 1996  . Shoulder arthroscopy Right 2002  . Knee arthroscopy Right 12/2006  . Repair peroneal tendons ankle Left 06/2009  . Knee arthroscopy Right 05/2010  . Tonsillectomy and adenoidectomy  1968  . Cervical biopsy  w/ loop electrode excision  01/1999  . Colposcopy  12/1998    Current Outpatient Prescriptions  Medication Sig Dispense Refill  . albuterol (PROAIR HFA) 108 (90 BASE) MCG/ACT inhaler Inhale two puffs every four to six hours as needed for cough or wheeze.  Can use two puffs 15 minutes prior  to exercise if needed. 3 Inhaler 0  . Cholecalciferol (VITAMIN D PO) Take 5,000 Units by mouth 2 (two) times daily.    Marland Kitchen DHEA 25 MG CAPS Take 1 capsule by mouth 2 (two) times a week.    . estradiol (VIVELLE-DOT) 0.05 MG/24HR patch     . fexofenadine (ALLEGRA) 180 MG tablet Take 180 mg by mouth daily.    . fluticasone (FLONASE) 50 MCG/ACT nasal spray Use one spray in each nostril twice daily to prevent runny nose and congestion. 48 g 1  . Ginger, Zingiber officinalis, (GINGER ROOT) 550 MG CAPS Take by mouth.    . IRON PO Take 18 mg by mouth.    . Methylcobalamin (METHYL B-12 PO) Take 1,000 mcg by mouth 2 (two) times a week.    . mometasone-formoterol (DULERA) 200-5 MCG/ACT AERO Inhale two puffs twice daily to prevent cough or wheeze.  Rinse, gargle, and spit after use. 3 Inhaler 1  . Multiple Vitamins-Minerals (ZINC PO) Take 50 mg by mouth. Solaray Biocitrate Zince.    . Nutritional Supplements (NUTRITIONAL SUPPLEMENT PO) Take 400 mg by mouth. Bio-Curumin    . Omega-3 Fatty Acids (OMEGA 3 PO) Take by mouth.    . Pregnenolone POWD 75 mg by Does not apply route.    Marland Kitchen PRESCRIPTION MEDICATION daily. T4- AND T3- 20 mcg. Compound medication.    Marland Kitchen PRESCRIPTION MEDICATION 1 application 2 (two) times a week. Testosterone 2% Cream.    .  Probiotic Product (PROBIOTIC DAILY PO) Take by mouth daily.    . progesterone (PROMETRIUM) 100 MG capsule Take 2 capsules by mouth daily.  3  . vitamin A 1610925000 UNIT capsule Take 25,000 Units by mouth.    Marland Kitchen. VITAMIN K PO Take 150 mcg by mouth. Vitamin K2 MK-7     No current facility-administered medications for this visit.    Family History  Problem Relation Age of Onset  . Osteoporosis Mother   . Fibroids Mother   . Hypertension Mother   . Colitis Mother   . Hyperlipidemia Mother   . Thyroid disease Mother   . COPD Mother   . Heart failure Mother   . Fibroids Sister   . Osteoporosis Maternal Grandmother   . Emphysema Maternal Grandfather   . Pulmonary  disease Maternal Grandfather   . Dementia Father   . Thyroid disease Brother   . Hyperlipidemia Brother     ROS:  Pertinent items are noted in HPI.  Otherwise, a comprehensive ROS was negative.  Exam:   BP 110/60 mmHg  Pulse 64  Ht 5\' 6"  (1.676 m)  Wt 212 lb (96.163 kg)  BMI 34.23 kg/m2  LMP 02/16/2015 (Approximate) Height: 5\' 6"  (167.6 cm) Ht Readings from Last 3 Encounters:  01/22/16 5\' 6"  (1.676 m)  01/11/15 5' 6.5" (1.689 m)  01/09/14 5' 6.5" (1.689 m)    General appearance: alert, cooperative and appears stated age Head: Normocephalic, without obvious abnormality, atraumatic Neck: no adenopathy, supple, symmetrical, trachea midline and thyroid normal to inspection and palpation Lungs: clear to auscultation bilaterally Breasts: normal appearance, no masses or tenderness Heart: regular rate and rhythm Abdomen: soft, non-tender; no masses,  no organomegaly Extremities: extremities normal, atraumatic, no cyanosis or edema Skin: Skin color, texture, turgor normal. A slight raised rashes right lower abdomen, no vesicles.  Looks like contact dermatitis but no other areas on torso or extremeties.  Not a mite or scabies, not poison ivy / oak. Lymph nodes: Cervical, supraclavicular, and axillary nodes normal. No abnormal inguinal nodes palpated Neurologic: Grossly normal   Pelvic: External genitalia:  no lesions              Urethra:  normal appearing urethra with no masses, tenderness or lesions              Bartholin's and Skene's: normal                 Vagina: normal appearing vagina with normal color and discharge, no lesions              Cervix: anteverted              Pap taken: Yes.   Bimanual Exam:  Uterus:  normal size, contour, position, consistency, mobility, non-tender              Adnexa: no mass, fullness, tenderness               Rectovaginal: Confirms               Anus:  normal sphincter tone, no lesions  Chaperone present: yes  A:  Well Woman with normal  exam  POP for contraception since 12/2010 - 7/ 2015, at risk for endo hyperplasia Now on Bio identical HRT at [facility] Hypertension, obesity, hypothyroid History of CIN III 01/1999 (pap for 20 years)  Rash right lower abdomen ? etiology  P:   Reviewed health and wellness pertinent to exam  Pap smear  as above  Mammogram is due 04/2016  HRT and other hormonal med's are given by [facility]  If rash not better to see dermatologist  Counseled on breast self exam, mammography screening, use and side effects of HRT, adequate intake of calcium and vitamin D, diet and exercise return annually or prn  An After Visit Summary was printed and given to the patient.

## 2016-01-22 NOTE — Telephone Encounter (Signed)
Patient is calling to give information regarding the date of her last Mammogram 05/10/2015 at [facility].

## 2016-01-22 NOTE — Patient Instructions (Addendum)

## 2016-01-22 NOTE — Telephone Encounter (Signed)
Call to [facility]. Verified most recent mammogram was performed on 05/10/2015. Requested results be faxed to the office at this time. Routing to Ria CommentPatricia Grubb, FNP as Lorain ChildesFYI. [facility]  Routing to provider for final review. Patient agreeable to disposition. Will close encounter.

## 2016-01-23 LAB — HEPATITIS C ANTIBODY: HCV AB: NEGATIVE

## 2016-01-24 LAB — IPS PAP TEST WITH HPV

## 2016-01-27 NOTE — Progress Notes (Signed)
Encounter reviewed by Dr. Brook Amundson C. Silva.  

## 2017-01-26 ENCOUNTER — Ambulatory Visit: Payer: Managed Care, Other (non HMO) | Admitting: Nurse Practitioner

## 2017-02-02 ENCOUNTER — Ambulatory Visit (INDEPENDENT_AMBULATORY_CARE_PROVIDER_SITE_OTHER): Payer: 59 | Admitting: Nurse Practitioner

## 2017-02-02 ENCOUNTER — Encounter: Payer: Self-pay | Admitting: Nurse Practitioner

## 2017-02-02 VITALS — BP 128/80 | HR 80 | Resp 20 | Ht 66.25 in | Wt 221.0 lb

## 2017-02-02 DIAGNOSIS — Z01411 Encounter for gynecological examination (general) (routine) with abnormal findings: Secondary | ICD-10-CM

## 2017-02-02 DIAGNOSIS — Z0000 Encounter for general adult medical examination without abnormal findings: Secondary | ICD-10-CM | POA: Diagnosis not present

## 2017-02-02 NOTE — Progress Notes (Signed)
Encounter reviewed by Dr. Brook Amundson C. Silva.  

## 2017-02-02 NOTE — Progress Notes (Signed)
56 y.o. G0P0 Married  Caucasian Fe here for annual exam.  No new problems.  PCP does her Vit D and the level is at 62.2.  On thyroid and HRT with [facility] and followed there every 3-6 months.  Fatigue had gotten bad again with weight gain and thyroid was adjusted.  TSH is much better, healthy lifestyle, gluten free and vegan diet.  Walking a lot with job with a lot of stretching.  No vaginal bleeding since 02/2015.  No vaso symptoms, no vaginal dryness.   Patient's last menstrual period was 02/16/2015 (approximate).          Sexually active: Yes.    The current method of family planning is post menopausal status.    Exercising: Yes.    Walking Smoker:  no  Health Maintenance: Pap: 01/22/16, Negative with neg HR HPV  01/11/15, Negative with neg HR HPV (history of CIN-III with LEEP 01/1999) MMG:05/09/15, Bi-Rads 1: Negative June 2017 Colonoscopy:08/2013, normal, repeat in 10 years BMD:2014, normal per patient TDaP: 05/03/09 Hep C: 01/22/16 HIV: 11/17/2010 Labs: PCP   reports that she has never smoked. She has never used smokeless tobacco. She reports that she does not drink alcohol or use drugs.  Past Medical History:  Diagnosis Date  . Chronic vulvitis 08/1990  . 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 Fibrocystic breast 10/1991  . Hashimoto's disease 2015    Past Surgical History:  Procedure Laterality Date  . CERVICAL BIOPSY  W/ LOOP ELECTRODE EXCISION  01/1999  . COLPOSCOPY  12/1998  . KNEE ARTHROSCOPY Right 12/2006  . KNEE ARTHROSCOPY Right 05/2010  . KNEE RECONSTRUCTION Right 1996  . REPAIR PERONEAL TENDONS ANKLE Left 06/2009  . SHOULDER ARTHROSCOPY Right 2002  . TONSILLECTOMY AND ADENOIDECTOMY  1968    Current Outpatient Prescriptions  Medication Sig Dispense Refill  . albuterol (PROAIR HFA) 108 (90 BASE) MCG/ACT inhaler Inhale two puffs every four to six hours as needed for  cough or wheeze.  Can use two puffs 15 minutes prior to exercise if needed. 3 Inhaler 0  . Cholecalciferol (VITAMIN D PO) Take 5,000 Units by mouth 2 (two) times daily.    Marland Kitchen DHEA 25 MG CAPS Take 1 capsule by mouth 2 (two) times a week.    . estradiol (VIVELLE-DOT) 0.05 MG/24HR patch     . fexofenadine (ALLEGRA) 180 MG tablet Take 180 mg by mouth daily.    . fluticasone (FLONASE) 50 MCG/ACT nasal spray Use one spray in each nostril twice daily to prevent runny nose and congestion. 48 g 1  . Ginger, Zingiber officinalis, (GINGER ROOT) 550 MG CAPS Take by mouth.    . IRON PO Take 18 mg by mouth.    . Methylcobalamin (METHYL B-12 PO) Take 1,000 mcg by mouth once a week.     . Multiple Vitamins-Minerals (ZINC PO) Take 50 mg by mouth. Solaray Biocitrate Zince.    . Nutritional Supplements (NUTRITIONAL SUPPLEMENT PO) Take 400 mg by mouth. Bio-Curumin    . Omega-3 Fatty Acids (OMEGA 3 PO) Take by mouth.    . Pregnenolone POWD 75 mg by Does not apply route.    Marland Kitchen PRESCRIPTION MEDICATION daily. T4- AND T3- 20 mcg. Compound medication.    Marland Kitchen PRESCRIPTION MEDICATION 1 application 2 (two) times a week. Testosterone 2% Cream.    . Probiotic Product (PROBIOTIC DAILY PO) Take by mouth daily.    . progesterone (PROMETRIUM) 100  MG capsule Take 2 capsules by mouth daily.  3  . vitamin A 1610925000 UNIT capsule Take 25,000 Units by mouth.    Marland Kitchen. VITAMIN K PO Take 150 mcg by mouth. Vitamin K2 MK-7     No current facility-administered medications for this visit.     Family History  Problem Relation Age of Onset  . Osteoporosis Mother   . Fibroids Mother   . Hypertension Mother   . Colitis Mother   . Hyperlipidemia Mother   . Thyroid disease Mother   . COPD Mother   . Heart failure Mother   . Fibroids Sister   . Osteoporosis Maternal Grandmother   . Emphysema Maternal Grandfather   . Pulmonary disease Maternal Grandfather   . Dementia Father   . Thyroid disease Brother   . Hyperlipidemia Brother      ROS:  Pertinent items are noted in HPI.  Otherwise, a comprehensive ROS was negative.  Exam:   BP 128/80 (BP Location: Right Arm, Patient Position: Sitting, Cuff Size: Large)   Pulse 80   Resp 20   Ht 5' 6.25" (1.683 m)   Wt 221 lb (100.2 kg)   LMP 02/16/2015 (Approximate)   BMI 35.40 kg/m  Height: 5' 6.25" (168.3 cm) Ht Readings from Last 3 Encounters:  02/02/17 5' 6.25" (1.683 m)  01/22/16 5\' 6"  (1.676 m)  01/11/15 5' 6.5" (1.689 m)    General appearance: alert, cooperative and appears stated age Head: Normocephalic, without obvious abnormality, atraumatic Neck: no adenopathy, supple, symmetrical, trachea midline and thyroid normal to inspection and palpation Lungs: clear to auscultation bilaterally Breasts: normal appearance, no masses or tenderness Heart: regular rate and rhythm Abdomen: soft, non-tender; no masses,  no organomegaly Extremities: extremities normal, atraumatic, no cyanosis or edema Skin: Skin color, texture, turgor normal. No rashes or lesions Lymph nodes: Cervical, supraclavicular, and axillary nodes normal. No abnormal inguinal nodes palpated Neurologic: Grossly normal   Pelvic: External genitalia:  no lesions              Urethra:  normal appearing urethra with no masses, tenderness or lesions              Bartholin's and Skene's: normal                 Vagina: normal appearing vagina with normal color and discharge, no lesions              Cervix: anteverted              Pap taken: Yes.   Bimanual Exam:  Uterus:  normal size, contour, position, consistency, mobility, non-tender              Adnexa: no mass, fullness, tenderness               Rectovaginal: Confirms               Anus:  normal sphincter tone, no lesions  Chaperone present: yes  A:  Well Woman with normal exam  POP for contraception since 12/2010 - 7/ 2015, at risk for endo hyperplasia Now on Bio identical HRT at [facility] Hypertension, obesity, hypothyroid History of CIN III 01/1999 (pap for 20 years)              P:   Reviewed health and wellness pertinent to exam  Pap smear as above  Mammogram is due 04/2017  med's are given at [facility]  Counseled on breast self  exam, mammography screening, use and side effects of HRT, adequate intake of calcium and vitamin D, diet and exercise return annually or prn  An After Visit Summary was printed and given to the patient.

## 2017-02-02 NOTE — Patient Instructions (Signed)

## 2017-02-05 LAB — IPS PAP TEST WITH HPV

## 2018-02-05 ENCOUNTER — Ambulatory Visit: Payer: 59 | Admitting: Nurse Practitioner

## 2018-02-10 ENCOUNTER — Ambulatory Visit: Payer: 59 | Admitting: Obstetrics and Gynecology

## 2018-02-16 ENCOUNTER — Ambulatory Visit (INDEPENDENT_AMBULATORY_CARE_PROVIDER_SITE_OTHER): Payer: 59 | Admitting: Obstetrics and Gynecology

## 2018-02-16 ENCOUNTER — Encounter: Payer: Self-pay | Admitting: Obstetrics and Gynecology

## 2018-02-16 ENCOUNTER — Other Ambulatory Visit: Payer: Self-pay

## 2018-02-16 VITALS — BP 112/78 | HR 60 | Resp 12 | Ht 66.25 in | Wt 229.0 lb

## 2018-02-16 DIAGNOSIS — Z01419 Encounter for gynecological examination (general) (routine) without abnormal findings: Secondary | ICD-10-CM | POA: Diagnosis not present

## 2018-02-16 DIAGNOSIS — E079 Disorder of thyroid, unspecified: Secondary | ICD-10-CM | POA: Diagnosis not present

## 2018-02-16 DIAGNOSIS — Z79890 Hormone replacement therapy (postmenopausal): Secondary | ICD-10-CM | POA: Diagnosis not present

## 2018-02-16 NOTE — Progress Notes (Addendum)
57 y.o. G0P0000 MarriedCaucasianF here for annual exam.   She is on Thyroid medication and HRT with [facility]. She is on HRT and testosterone cream. She is having tolerable vasomotor symptoms.  Sexually active, no pain. No vaginal bleeding.     Patient's last menstrual period was 02/16/2015 (approximate).          Sexually active: Yes.    The current method of family planning is post menopausal status.    Exercising: Yes.    walking Smoker:  no  Health Maintenance: Pap:  02-02-17 WNL NEG HR HPV 01-22-16 WNL NEG HR HPV 01-11-15 WNL NEG HR HPV  History of abnormal Pap:  Yes had colposcopy and LEEP in 2000  MMG:  Left breast U/S Normal, 7/18 Colonoscopy:  09-17-13 Normal  BMD:   Years ago TDaP:  04-23-09 Gardasil: N/A   reports that she has never smoked. She has never used smokeless tobacco. She reports that she does not drink alcohol or use drugs. 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 Husband is a Ambulance person.   Past Medical History:  Diagnosis Date  . Chronic vulvitis 08/1990  . 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 Fibrocystic breast 10/1991  . Hashimoto's disease 2015    Past Surgical History:  Procedure Laterality Date  . CERVICAL BIOPSY  W/ LOOP ELECTRODE EXCISION  01/1999  . COLPOSCOPY  12/1998  . KNEE ARTHROSCOPY Right 12/2006  . KNEE ARTHROSCOPY Right 05/2010  . KNEE RECONSTRUCTION Right 1996  . REPAIR PERONEAL TENDONS ANKLE Left 06/2009  . SHOULDER ARTHROSCOPY Right 2002  . TONSILLECTOMY AND ADENOIDECTOMY  1968    Current Outpatient Medications  Medication Sig Dispense Refill  . albuterol (PROAIR HFA) 108 (90 BASE) MCG/ACT inhaler Inhale two puffs every four to six hours as needed for cough or wheeze.  Can use two puffs 15 minutes prior to exercise if needed. 3 Inhaler 0  . Cholecalciferol (VITAMIN D PO) Take 5,000 Units by mouth 2 (two)  times daily.    Marland Kitchen DHEA 25 MG CAPS Take 1 capsule by mouth 2 (two) times a week.    . estradiol (CLIMARA - DOSED IN MG/24 HR) 0.0375 mg/24hr patch     . fluticasone (FLONASE) 50 MCG/ACT nasal spray Use one spray in each nostril twice daily to prevent runny nose and congestion. 48 g 1  . Ginger, Zingiber officinalis, (GINGER ROOT) 550 MG CAPS Take by mouth.    . IRON PO Take 18 mg by mouth.    . Methylcobalamin (METHYL B-12 PO) Take 1,000 mcg by mouth once a week.     . Multiple Vitamins-Minerals (ZINC PO) Take 50 mg by mouth. Solaray Biocitrate Zince.    . Nutritional Supplements (NUTRITIONAL SUPPLEMENT PO) Take 400 mg by mouth. Bio-Curumin    . Omega-3 Fatty Acids (OMEGA 3 PO) Take by mouth.    Marland Kitchen PRESCRIPTION MEDICATION daily. T4- AND T3- . Compound medication.    Marland Kitchen PRESCRIPTION MEDICATION 1 application 2 (two) times a week. Testosterone 2% Cream.    . Probiotic Product (PROBIOTIC DAILY PO) Take by mouth daily.    . progesterone (PROMETRIUM) 100 MG capsule Take 1 capsule by mouth daily.   3  . vitamin A 29562 UNIT capsule Take 25,000 Units by mouth.    Marland Kitchen VITAMIN K PO Take 150 mcg by mouth. Vitamin K2 MK-7     No current facility-administered medications  for this visit.     Family History  Problem Relation Age of Onset  . Osteoporosis Mother   . Fibroids Mother   . Hypertension Mother   . Colitis Mother   . Hyperlipidemia Mother   . Thyroid disease Mother   . COPD Mother   . Heart failure Mother   . Fibroids Sister   . Dementia Father   . Thyroid disease Brother   . Hyperlipidemia Brother   . Osteoporosis Maternal Grandmother   . Emphysema Maternal Grandfather   . Pulmonary disease Maternal Grandfather     Review of Systems  Constitutional: Negative.   HENT: Negative.   Eyes: Negative.   Respiratory: Negative.   Cardiovascular: Negative.   Gastrointestinal: Negative.   Endocrine: Negative.   Genitourinary: Negative.   Musculoskeletal: Negative.   Skin:  Negative.   Allergic/Immunologic: Negative.   Neurological: Negative.   Psychiatric/Behavioral: Negative.     Exam:   BP 112/78 (BP Location: Right Arm, Patient Position: Sitting, Cuff Size: Normal)   Pulse 60   Resp 12   Ht 5' 6.25" (1.683 m)   Wt 229 lb (103.9 kg)   LMP 02/16/2015 (Approximate)   BMI 36.68 kg/m   Weight change: @WEIGHTCHANGE @ Height:   Height: 5' 6.25" (168.3 cm)  Ht Readings from Last 3 Encounters:  02/16/18 5' 6.25" (1.683 m)  02/02/17 5' 6.25" (1.683 m)  01/22/16 5\' 6"  (1.676 m)    General appearance: alert, cooperative and appears stated age Head: Normocephalic, without obvious abnormality, atraumatic Neck: no adenopathy, supple, symmetrical, trachea midline and thyroid normal to inspection and palpation Lungs: clear to auscultation bilaterally Cardiovascular: regular rate and rhythm Breasts: normal appearance, no masses or tenderness Abdomen: soft, non-tender; non distended,  no masses,  no organomegaly Extremities: extremities normal, atraumatic, no cyanosis or edema Skin: Skin color, texture, turgor normal. No rashes or lesions Lymph nodes: Cervical, supraclavicular, and axillary nodes normal. No abnormal inguinal nodes palpated Neurologic: Grossly normal   Pelvic: External genitalia:  no lesions              Urethra:  normal appearing urethra with no masses, tenderness or lesions              Bartholins and Skenes: normal                 Vagina: normal appearing vagina with normal color and discharge, no lesions              Cervix: no lesions               Bimanual Exam:  Uterus:  normal size, contour, position, consistency, mobility, non-tender              Adnexa: no mass, fullness, tenderness               Rectovaginal: Confirms               Anus:  normal sphincter tone, no lesions  Chaperone was present for exam.  A:  Well Woman with normal exam  On HRT, including testosterone, managed by integrative provider.   P:   Labs at  integrative provider at at work. Will get a copy  No pap this year  Mammogram in the summer  Colonoscopy UTD  Discussed breast self awareness  On vit D   Addendum: Labs reviewed Normal testosterone of 16 (3-41), 0.6 (0-9.5) FT4 normal at 1 ng/dl, TSH slightly elevated at 4.95 (upper limit 4.5), thyroglobulin AB negative,  FT3 normal Estradiol 12.8 (PMP range listed as <6-54.7) Vit D 55 CRP normal

## 2018-02-16 NOTE — Patient Instructions (Signed)
EXERCISE AND DIET:  We recommended that you start or continue a regular exercise program for good health. Regular exercise means any activity that makes your heart beat faster and makes you sweat.  We recommend exercising at least 30 minutes per day at least 3 days a week, preferably 4 or 5.  We also recommend a diet low in fat and sugar.  Inactivity, poor dietary choices and obesity can cause diabetes, heart attack, stroke, and kidney damage, among others.    ALCOHOL AND SMOKING:  Women should limit their alcohol intake to no more than 7 drinks/beers/glasses of wine (combined, not each!) per week. Moderation of alcohol intake to this level decreases your risk of breast cancer and liver damage. And of course, no recreational drugs are part of a healthy lifestyle.  And absolutely no smoking or even second hand smoke. Most people know smoking can cause heart and lung diseases, but did you know it also contributes to weakening of your bones? Aging of your skin?  Yellowing of your teeth and nails?  CALCIUM AND VITAMIN D:  Adequate intake of calcium and Vitamin D are recommended.  The recommendations for exact amounts of these supplements seem to change often, but generally speaking 600 mg of calcium (either carbonate or citrate) and 800 units of Vitamin D per day seems prudent. Certain women may benefit from higher intake of Vitamin D.  If you are among these women, your doctor will have told you during your visit.    PAP SMEARS:  Pap smears, to check for cervical cancer or precancers,  have traditionally been done yearly, although recent scientific advances have shown that most women can have pap smears less often.  However, every woman still should have a physical exam from her gynecologist every year. It will include a breast check, inspection of the vulva and vagina to check for abnormal growths or skin changes, a visual exam of the cervix, and then an exam to evaluate the size and shape of the uterus and  ovaries.  And after 56 years of age, a rectal exam is indicated to check for rectal cancers. We will also provide age appropriate advice regarding health maintenance, like when you should have certain vaccines, screening for sexually transmitted diseases, bone density testing, colonoscopy, mammograms, etc.   MAMMOGRAMS:  All women over 40 years old should have a yearly mammogram. Many facilities now offer a "3D" mammogram, which may cost around $50 extra out of pocket. If possible,  we recommend you accept the option to have the 3D mammogram performed.  It both reduces the number of women who will be called back for extra views which then turn out to be normal, and it is better than the routine mammogram at detecting truly abnormal areas.    COLONOSCOPY:  Colonoscopy to screen for colon cancer is recommended for all women at age 50.  We know, you hate the idea of the prep.  We agree, BUT, having colon cancer and not knowing it is worse!!  Colon cancer so often starts as a polyp that can be seen and removed at colonscopy, which can quite literally save your life!  And if your first colonoscopy is normal and you have no family history of colon cancer, most women don't have to have it again for 10 years.  Once every ten years, you can do something that may end up saving your life, right?  We will be happy to help you get it scheduled when you are ready.    Be sure to check your insurance coverage so you understand how much it will cost.  It may be covered as a preventative service at no cost, but you should check your particular policy.      Breast Self-Awareness Breast self-awareness means being familiar with how your breasts look and feel. It involves checking your breasts regularly and reporting any changes to your health care provider. Practicing breast self-awareness is important. A change in your breasts can be a sign of a serious medical problem. Being familiar with how your breasts look and feel allows  you to find any problems early, when treatment is more likely to be successful. All women should practice breast self-awareness, including women who have had breast implants. How to do a breast self-exam One way to learn what is normal for your breasts and whether your breasts are changing is to do a breast self-exam. To do a breast self-exam: Look for Changes  1. Remove all the clothing above your waist. 2. Stand in front of a mirror in a room with good lighting. 3. Put your hands on your hips. 4. Push your hands firmly downward. 5. Compare your breasts in the mirror. Look for differences between them (asymmetry), such as: ? Differences in shape. ? Differences in size. ? Puckers, dips, and bumps in one breast and not the other. 6. Look at each breast for changes in your skin, such as: ? Redness. ? Scaly areas. 7. Look for changes in your nipples, such as: ? Discharge. ? Bleeding. ? Dimpling. ? Redness. ? A change in position. Feel for Changes  Carefully feel your breasts for lumps and changes. It is best to do this while lying on your back on the floor and again while sitting or standing in the shower or tub with soapy water on your skin. Feel each breast in the following way:  Place the arm on the side of the breast you are examining above your head.  Feel your breast with the other hand.  Start in the nipple area and make  inch (2 cm) overlapping circles to feel your breast. Use the pads of your three middle fingers to do this. Apply light pressure, then medium pressure, then firm pressure. The light pressure will allow you to feel the tissue closest to the skin. The medium pressure will allow you to feel the tissue that is a little deeper. The firm pressure will allow you to feel the tissue close to the ribs.  Continue the overlapping circles, moving downward over the breast until you feel your ribs below your breast.  Move one finger-width toward the center of the body.  Continue to use the  inch (2 cm) overlapping circles to feel your breast as you move slowly up toward your collarbone.  Continue the up and down exam using all three pressures until you reach your armpit.  Write Down What You Find  Write down what is normal for each breast and any changes that you find. Keep a written record with breast changes or normal findings for each breast. By writing this information down, you do not need to depend only on memory for size, tenderness, or location. Write down where you are in your menstrual cycle, if you are still menstruating. If you are having trouble noticing differences in your breasts, do not get discouraged. With time you will become more familiar with the variations in your breasts and more comfortable with the exam. How often should I examine my breasts? Examine   your breasts every month. If you are breastfeeding, the best time to examine your breasts is after a feeding or after using a breast pump. If you menstruate, the best time to examine your breasts is 5-7 days after your period is over. During your period, your breasts are lumpier, and it may be more difficult to notice changes. When should I see my health care provider? See your health care provider if you notice:  A change in shape or size of your breasts or nipples.  A change in the skin of your breast or nipples, such as a reddened or scaly area.  Unusual discharge from your nipples.  A lump or thick area that was not there before.  Pain in your breasts.  Anything that concerns you.  This information is not intended to replace advice given to you by your health care provider. Make sure you discuss any questions you have with your health care provider. Document Released: 11/03/2005 Document Revised: 04/10/2016 Document Reviewed: 09/23/2015 Elsevier Interactive Patient Education  2018 Elsevier Inc.  

## 2019-03-14 ENCOUNTER — Ambulatory Visit: Payer: 59 | Admitting: Obstetrics and Gynecology

## 2019-04-13 ENCOUNTER — Other Ambulatory Visit: Payer: Self-pay

## 2019-04-14 NOTE — Progress Notes (Signed)
58 y.o. G0P0000 Married White or Caucasian Not Hispanic or Latino female here for annual exam.  She is on HRT, testosterone and thyroid medication through [facility]. They are monitoring her levels. No vaginal bleeding, no dyspareunia. No bowel or bladder c/o.    Patient's last menstrual period was 02/16/2015 (approximate).          Sexually active: Yes.    The current method of family planning is post menopausal status.    Exercising: Yes.    Walking  Smoker:  no  Health Maintenance: Pap:  02-02-17 WNL NEG HR HPV 01-22-16 WNL NEG HR HPV 01-11-15 WNL NEG HR HPV  History of abnormal Pap:  Yes had colposcopy and LEEP in 2000  MMG:  05/2017 Birads 2 benign Colonoscopy:  09-17-13 Normal  BMD:   Years ago TDaP:  04-23-09 Gardasil: N/A   reports that she has never smoked. She has never used smokeless tobacco. She reports that she does not drink alcohol or use drugs. 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 Husband is a Ambulance person  Past Medical History:  Diagnosis Date  . Chronic vulvitis 08/1990  . 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 Fibrocystic breast 10/1991  . Hashimoto's disease 2015    Past Surgical History:  Procedure Laterality Date  . CERVICAL BIOPSY  W/ LOOP ELECTRODE EXCISION  01/1999  . COLPOSCOPY  12/1998  . KNEE ARTHROSCOPY Right 12/2006  . KNEE ARTHROSCOPY Right 05/2010  . KNEE RECONSTRUCTION Right 1996  . REPAIR PERONEAL TENDONS ANKLE Left 06/2009  . SHOULDER ARTHROSCOPY Right 2002  . TONSILLECTOMY AND ADENOIDECTOMY  1968    Current Outpatient Medications  Medication Sig Dispense Refill  . albuterol (PROAIR HFA) 108 (90 BASE) MCG/ACT inhaler Inhale two puffs every four to six hours as needed for cough or wheeze.  Can use two puffs 15 minutes prior to exercise if needed. 3 Inhaler 0  . Cholecalciferol (VITAMIN D PO) Take 5,000 Units by mouth 2  (two) times daily.    Marland Kitchen DHEA 25 MG CAPS Take 1 capsule by mouth 2 (two) times a week.    . estradiol (CLIMARA - DOSED IN MG/24 HR) 0.0375 mg/24hr patch     . fluticasone (FLONASE) 50 MCG/ACT nasal spray Use one spray in each nostril twice daily to prevent runny nose and congestion. 48 g 1  . Ginger, Zingiber officinalis, (GINGER ROOT) 550 MG CAPS Take by mouth.    . IRON PO Take 18 mg by mouth.    . Methylcobalamin (METHYL B-12 PO) Take 1,000 mcg by mouth once a week.     . Multiple Vitamins-Minerals (ZINC PO) Take 50 mg by mouth. Solaray Biocitrate Zince.    . Nutritional Supplements (NUTRITIONAL SUPPLEMENT PO) Take 400 mg by mouth. Bio-Curumin    . Omega-3 Fatty Acids (OMEGA 3 PO) Take by mouth.    Marland Kitchen PRESCRIPTION MEDICATION daily. T4- AND T3- . Compound medication.    Marland Kitchen PRESCRIPTION MEDICATION 1 application 2 (two) times a week. Testosterone 2% Cream.    . Probiotic Product (PROBIOTIC DAILY PO) Take by mouth daily.    . progesterone (PROMETRIUM) 100 MG capsule Take 1 capsule by mouth daily.   3  . vitamin A 92446 UNIT capsule Take 25,000 Units by mouth.    Marland Kitchen VITAMIN K PO Take 150 mcg by mouth. Vitamin K2 MK-7     No current facility-administered medications  for this visit.     Family History  Problem Relation Age of Onset  . Osteoporosis Mother   . Fibroids Mother   . Hypertension Mother   . Colitis Mother   . Hyperlipidemia Mother   . Thyroid disease Mother   . COPD Mother   . Heart failure Mother   . Fibroids Sister   . Dementia Father   . Thyroid disease Brother   . Hyperlipidemia Brother   . Osteoporosis Maternal Grandmother   . Emphysema Maternal Grandfather   . Pulmonary disease Maternal Grandfather     Review of Systems  Constitutional: Negative.   HENT: Negative.   Eyes: Negative.   Respiratory: Negative.   Cardiovascular: Negative.   Gastrointestinal: Negative.   Endocrine: Negative.   Genitourinary: Negative.   Musculoskeletal: Negative.    Skin: Negative.   Allergic/Immunologic: Negative.   Neurological: Negative.   Hematological: Negative.   Psychiatric/Behavioral: Negative.     Exam:   BP 118/80 (BP Location: Right Arm, Patient Position: Sitting, Cuff Size: Large)   Pulse 76   Temp 98.4 F (36.9 C) (Skin)   Ht 5' 6.34" (1.685 m)   Wt 241 lb (109.3 kg)   LMP 02/16/2015 (Approximate)   BMI 38.50 kg/m   Weight change: @WEIGHTCHANGE @ Height:   Height: 5' 6.34" (168.5 cm)  Ht Readings from Last 3 Encounters:  04/15/19 5' 6.34" (1.685 m)  02/16/18 5' 6.25" (1.683 m)  02/02/17 5' 6.25" (1.683 m)    General appearance: alert, cooperative and appears stated age Head: Normocephalic, without obvious abnormality, atraumatic Neck: no adenopathy, supple, symmetrical, trachea midline and thyroid normal to inspection and palpation Lungs: clear to auscultation bilaterally Cardiovascular: regular rate and rhythm Breasts: normal appearance, no masses or tenderness Abdomen: soft, non-tender; non distended,  no masses,  no organomegaly Extremities: extremities normal, atraumatic, no cyanosis or edema Skin: Skin color, texture, turgor normal. No rashes or lesions Lymph nodes: Cervical, supraclavicular, and axillary nodes normal. No abnormal inguinal nodes palpated Neurologic: Grossly normal   Pelvic: External genitalia:  no lesions              Urethra:  normal appearing urethra with no masses, tenderness or lesions              Bartholins and Skenes: normal                 Vagina: normal appearing vagina with normal color and discharge, no lesions              Cervix: no lesions               Bimanual Exam:  Uterus:  no masses or tenderness              Adnexa: no mass, fullness, tenderness               Rectovaginal: Confirms               Anus:  normal sphincter tone, no lesions  Chaperone was present for exam.  A:  Well Woman with normal exam  On HRT/testosterone, followed by [facility]  P:   Pap with  hpv  Discussed breast self exam  Discussed calcium and vit D intake  Get a copy of her labs  Mammogram due  Colonoscopy UTD  TDAP today

## 2019-04-15 ENCOUNTER — Other Ambulatory Visit (HOSPITAL_COMMUNITY)
Admission: RE | Admit: 2019-04-15 | Discharge: 2019-04-15 | Disposition: A | Discharge status: Home / Self Care | Payer: 59 | Source: Ambulatory Visit | Attending: Obstetrics and Gynecology | Admitting: Obstetrics and Gynecology

## 2019-04-15 ENCOUNTER — Encounter: Payer: Self-pay | Admitting: Obstetrics and Gynecology

## 2019-04-15 ENCOUNTER — Other Ambulatory Visit: Payer: Self-pay

## 2019-04-15 ENCOUNTER — Ambulatory Visit (INDEPENDENT_AMBULATORY_CARE_PROVIDER_SITE_OTHER): Payer: 59 | Admitting: Obstetrics and Gynecology

## 2019-04-15 VITALS — BP 118/80 | HR 76 | Temp 98.4°F | Ht 66.34 in | Wt 241.0 lb

## 2019-04-15 DIAGNOSIS — Z124 Encounter for screening for malignant neoplasm of cervix: Secondary | ICD-10-CM | POA: Diagnosis not present

## 2019-04-15 DIAGNOSIS — Z01419 Encounter for gynecological examination (general) (routine) without abnormal findings: Secondary | ICD-10-CM | POA: Diagnosis not present

## 2019-04-15 DIAGNOSIS — Z79890 Hormone replacement therapy (postmenopausal): Secondary | ICD-10-CM

## 2019-04-15 DIAGNOSIS — Z23 Encounter for immunization: Secondary | ICD-10-CM

## 2019-04-15 NOTE — Patient Instructions (Signed)

## 2019-04-15 NOTE — Addendum Note (Signed)
Addended by: Tobi Bastos on: 04/15/2019 04:39 PM   Modules accepted: Orders

## 2019-04-19 LAB — CYTOLOGY - PAP
Diagnosis: NEGATIVE
HPV: NOT DETECTED

## 2019-08-31 ENCOUNTER — Encounter: Payer: Self-pay | Admitting: Obstetrics and Gynecology

## 2020-04-17 NOTE — Progress Notes (Signed)
59 y.o. G0P0000 Married White or Caucasian Not Hispanic or Latino female here for annual exam.   She is on HRT, testosterone and thyroid medication through [facility]. They are monitoring her levels. She missed a few doses of her progesterone and spotted for a few days. Not sexually active currently with husbands medical issues. No h/o dyspareunia.   Husband had a staph infection and lost all of his toes on one foot.     Patient's last menstrual period was 02/16/2015 (approximate).          Sexually active: Yes.    The current method of family planning is post menopausal status.    Exercising: Yes.    walking Smoker:  no  Health Maintenance: Pap:  02-02-17 WNL NEG HR HPV 01-22-16 WNL NEG HR HPV 01-11-15 WNL NEG HR HPV History of abnormal Pap:  Yeshad colposcopy and LEEP in 2000 MMG:  08/31/19 Bi-rads 1 neg  BMD:   Years ago Colonoscopy:09-17-13 Normal, f/u in 10 years.   TDaP:  04/15/2019 Gardasil: NA   reports that she has never smoked. She has never used smokeless tobacco. She reports that she does not drink alcohol or use drugs. 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 Husband is a Buyer, retail  Past Medical History:  Diagnosis Date  . Chronic vulvitis 08/1990  . 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 Fibrocystic breast 10/1991  . Hashimoto's disease 2015    Past Surgical History:  Procedure Laterality Date  . CERVICAL BIOPSY  W/ LOOP ELECTRODE EXCISION  01/1999  . COLPOSCOPY  12/1998  . KNEE ARTHROSCOPY Right 12/2006  . KNEE ARTHROSCOPY Right 05/2010  . KNEE RECONSTRUCTION Right 1996  . REPAIR PERONEAL TENDONS ANKLE Left 06/2009  . SHOULDER ARTHROSCOPY Right 2002  . TONSILLECTOMY AND ADENOIDECTOMY  1968    Current Outpatient Medications  Medication Sig Dispense Refill  . albuterol (PROAIR HFA) 108 (90 BASE) MCG/ACT inhaler Inhale two puffs  every four to six hours as needed for cough or wheeze.  Can use two puffs 15 minutes prior to exercise if needed. 3 Inhaler 0  . Cholecalciferol (VITAMIN D PO) Take 5,000 Units by mouth 2 (two) times daily.    Marland Kitchen DHEA 25 MG CAPS Take 1 capsule by mouth 2 (two) times a week.    . estradiol (CLIMARA - DOSED IN MG/24 HR) 0.0375 mg/24hr patch     . fluticasone (FLONASE) 50 MCG/ACT nasal spray Use one spray in each nostril twice daily to prevent runny nose and congestion. 48 g 1  . Ginger, Zingiber officinalis, (GINGER ROOT) 550 MG CAPS Take by mouth.    . IRON PO Take 18 mg by mouth.    . Methylcobalamin (METHYL B-12 PO) Take 1,000 mcg by mouth once a week.     . Multiple Vitamins-Minerals (ZINC PO) Take 50 mg by mouth. Solaray Biocitrate Zince.    . Nutritional Supplements (NUTRITIONAL SUPPLEMENT PO) Take 400 mg by mouth. Turmeric    . Omega-3 Fatty Acids (OMEGA 3 PO) Take by mouth.    Marland Kitchen PRESCRIPTION MEDICATION daily. T4- 186mcg AND T3- 46mcg. Compound medication.    Marland Kitchen PRESCRIPTION MEDICATION 1 application 2 (two) times a week. Testosterone 2% Cream.    . Probiotic Product (PROBIOTIC DAILY PO) Take by mouth daily.    . progesterone (PROMETRIUM) 100 MG capsule Take 1 capsule by mouth daily.   3  . vitamin  A 25000 UNIT capsule Take 25,000 Units by mouth.    Marland Kitchen VITAMIN K PO Take 150 mcg by mouth. Vitamin K2 MK-7     No current facility-administered medications for this visit.    Family History  Problem Relation Age of Onset  . Osteoporosis Mother   . Fibroids Mother   . Hypertension Mother   . Colitis Mother   . Hyperlipidemia Mother   . Thyroid disease Mother   . COPD Mother   . Heart failure Mother   . Fibroids Sister   . Dementia Father   . Thyroid disease Brother   . Hyperlipidemia Brother   . Osteoporosis Maternal Grandmother   . Emphysema Maternal Grandfather   . Pulmonary disease Maternal Grandfather     Review of Systems  Constitutional: Negative.   HENT: Negative.   Eyes:  Negative.   Respiratory: Negative.   Cardiovascular: Negative.   Gastrointestinal: Negative.   Endocrine: Negative.   Genitourinary: Negative.   Musculoskeletal: Negative.   Skin: Negative.   Allergic/Immunologic: Negative.   Neurological: Negative.   Hematological: Negative.   Psychiatric/Behavioral: Negative.     Exam:   BP 122/74 (BP Location: Right Arm, Patient Position: Sitting, Cuff Size: Large)   Pulse 63   Temp (!) 97.3 F (36.3 C) (Temporal)   Ht 5' 6.5" (1.689 m)   Wt 233 lb (105.7 kg)   LMP 02/16/2015 (Approximate)   SpO2 100%   BMI 37.04 kg/m   Weight change: @WEIGHTCHANGE @ Height:   Height: 5' 6.5" (168.9 cm)  Ht Readings from Last 3 Encounters:  04/19/20 5' 6.5" (1.689 m)  04/15/19 5' 6.34" (1.685 m)  02/16/18 5' 6.25" (1.683 m)    General appearance: alert, cooperative and appears stated age Head: Normocephalic, without obvious abnormality, atraumatic Neck: no adenopathy, supple, symmetrical, trachea midline and thyroid normal to inspection and palpation Lungs: clear to auscultation bilaterally Cardiovascular: regular rate and rhythm Breasts: normal appearance, no masses or tenderness Abdomen: soft, non-tender; non distended,  no masses,  no organomegaly Extremities: extremities normal, atraumatic, no cyanosis or edema Skin: Skin color, texture, turgor normal. No rashes or lesions Lymph nodes: Cervical, supraclavicular, and axillary nodes normal. No abnormal inguinal nodes palpated Neurologic: Grossly normal   Pelvic: External genitalia:  no lesions              Urethra:  normal appearing urethra with no masses, tenderness or lesions              Bartholins and Skenes: normal                 Vagina: normal appearing vagina with normal color and discharge, no lesions              Cervix: no lesions and some brown blood seen on her cervix. Cervix not friable               Bimanual Exam:  Uterus:  normal size, contour, position, consistency, mobility,  non-tender              Adnexa: no mass, fullness, tenderness               Rectovaginal: Confirms               Anus:  normal sphincter tone, no lesions  06-02-2000 chaperoned for the exam.  A:  Well Woman with normal exam  H/O LEEP in 2000  On HRT/testosterone, followed by [facility]  PMP spotting  Vegan diet  P:   Pap with hpv  Discussed breast self exam  Discussed calcium and vit D intake  Mammogram in the fall  Colonoscopy UTD  Return for ultrasound

## 2020-04-18 ENCOUNTER — Other Ambulatory Visit: Payer: Self-pay

## 2020-04-19 ENCOUNTER — Ambulatory Visit (INDEPENDENT_AMBULATORY_CARE_PROVIDER_SITE_OTHER): Payer: 59 | Admitting: Obstetrics and Gynecology

## 2020-04-19 ENCOUNTER — Telehealth: Payer: Self-pay | Admitting: Obstetrics and Gynecology

## 2020-04-19 ENCOUNTER — Encounter: Payer: Self-pay | Admitting: Obstetrics and Gynecology

## 2020-04-19 ENCOUNTER — Other Ambulatory Visit (HOSPITAL_COMMUNITY)
Admission: RE | Admit: 2020-04-19 | Discharge: 2020-04-19 | Disposition: A | Discharge status: Home / Self Care | Payer: 59 | Source: Ambulatory Visit | Attending: Obstetrics and Gynecology | Admitting: Obstetrics and Gynecology

## 2020-04-19 VITALS — BP 122/74 | HR 63 | Temp 97.3°F | Ht 66.5 in | Wt 233.0 lb

## 2020-04-19 DIAGNOSIS — N950 Postmenopausal bleeding: Secondary | ICD-10-CM | POA: Diagnosis not present

## 2020-04-19 DIAGNOSIS — Z124 Encounter for screening for malignant neoplasm of cervix: Secondary | ICD-10-CM

## 2020-04-19 DIAGNOSIS — Z01419 Encounter for gynecological examination (general) (routine) without abnormal findings: Secondary | ICD-10-CM

## 2020-04-19 DIAGNOSIS — Z79890 Hormone replacement therapy (postmenopausal): Secondary | ICD-10-CM

## 2020-04-19 NOTE — Patient Instructions (Signed)

## 2020-04-19 NOTE — Telephone Encounter (Signed)
Spoke with patient regarding benefits for recommended ultrasound. Patient is aware that ultrasound is transvaginal. Patient acknowledges understanding of information presented. Patient is aware of cancellation policy. Patient scheduled appointment for 05/01/2020 at 0400PM with Gertie Exon, MD. Encounter closed.

## 2020-04-20 LAB — CYTOLOGY - PAP
Adequacy: ABSENT
Comment: NEGATIVE
Diagnosis: NEGATIVE
High risk HPV: NEGATIVE

## 2020-05-01 ENCOUNTER — Ambulatory Visit (INDEPENDENT_AMBULATORY_CARE_PROVIDER_SITE_OTHER): Payer: 59

## 2020-05-01 ENCOUNTER — Ambulatory Visit (INDEPENDENT_AMBULATORY_CARE_PROVIDER_SITE_OTHER): Payer: 59 | Admitting: Obstetrics and Gynecology

## 2020-05-01 ENCOUNTER — Other Ambulatory Visit: Payer: Self-pay

## 2020-05-01 ENCOUNTER — Encounter: Payer: Self-pay | Admitting: Obstetrics and Gynecology

## 2020-05-01 VITALS — BP 122/68 | HR 90 | Temp 98.2°F | Ht 66.5 in | Wt 233.0 lb

## 2020-05-01 DIAGNOSIS — N950 Postmenopausal bleeding: Secondary | ICD-10-CM

## 2020-05-01 NOTE — Progress Notes (Signed)
GYNECOLOGY  VISIT   HPI: 59 y.o.   Married White or Caucasian Not Hispanic or Latino  female   G0P0000 with Patient's last menstrual period was 02/16/2015 (approximate).   here for Ultrasound consult for PMP spotting on HRT (from [facility]). The spotting occurred after she missed a few doses of her progesterone.   GYNECOLOGIC HISTORY: Patient's last menstrual period was 02/16/2015 (approximate). Contraception:PMP Menopausal hormone therapy: HRT        OB History    Gravida  0   Para  0   Term  0   Preterm  0   AB  0   Living  0     SAB  0   TAB  0   Ectopic  0   Multiple  0   Live Births  0              Patient Active Problem List   Diagnosis Date Noted  . Unspecified hypothyroidism 01/09/2014  . History of abnormal cervical Pap smear 01/09/2014  . Obesity, unspecified 01/09/2014  . History of tear of ACL (anterior cruciate ligament) 11/15/2012  . Osteoarthritis of knee 01/27/2012  . ANKLE PAIN, BILATERAL 10/16/2009  . METATARSALGIA 10/16/2009  . OTHER ENTHESOPATHY OF ANKLE AND TARSUS 10/16/2009  . CAVUS DEFORMITY OF FOOT, ACQUIRED 10/16/2009  . ABNORMALITY OF GAIT 10/16/2009    Past Medical History:  Diagnosis Date  . Chronic vulvitis 08/1990  . 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 Fibrocystic breast 10/1991  . Hashimoto's disease 2015    Past Surgical History:  Procedure Laterality Date  . CERVICAL BIOPSY  W/ LOOP ELECTRODE EXCISION  01/1999  . COLPOSCOPY  12/1998  . KNEE ARTHROSCOPY Right 12/2006  . KNEE ARTHROSCOPY Right 05/2010  . KNEE RECONSTRUCTION Right 1996  . REPAIR PERONEAL TENDONS ANKLE Left 06/2009  . SHOULDER ARTHROSCOPY Right 2002  . TONSILLECTOMY AND ADENOIDECTOMY  1968    Current Outpatient Medications  Medication Sig Dispense Refill  . albuterol (PROAIR HFA) 108 (90 BASE) MCG/ACT inhaler Inhale two puffs every four to six  hours as needed for cough or wheeze.  Can use two puffs 15 minutes prior to exercise if needed. 3 Inhaler 0  . Cholecalciferol (VITAMIN D PO) Take 5,000 Units by mouth 2 (two) times daily.    Marland Kitchen DHEA 25 MG CAPS Take 1 capsule by mouth 2 (two) times a week.    . fluticasone (FLONASE) 50 MCG/ACT nasal spray Use one spray in each nostril twice daily to prevent runny nose and congestion. 48 g 1  . Ginger, Zingiber officinalis, (GINGER ROOT) 550 MG CAPS Take by mouth.    . IRON PO Take 18 mg by mouth.    . Methylcobalamin (METHYL B-12 PO) Take 1,000 mcg by mouth once a week.     . Multiple Vitamins-Minerals (ZINC PO) Take 50 mg by mouth. Solaray Biocitrate Zince.    . Nutritional Supplements (NUTRITIONAL SUPPLEMENT PO) Take 400 mg by mouth. Turmeric    . Omega-3 Fatty Acids (OMEGA 3 PO) Take by mouth.    Marland Kitchen PRESCRIPTION MEDICATION daily. T4- 175mcg AND T3- 73mcg. Compound medication.    Marland Kitchen PRESCRIPTION MEDICATION 1 application 2 (two) times a week. Testosterone 2% Cream.    . Probiotic Product (PROBIOTIC DAILY PO) Take by mouth daily.    . progesterone (PROMETRIUM) 100 MG capsule Take 1 capsule by mouth daily.   3  .  vitamin A 41324 UNIT capsule Take 25,000 Units by mouth.    Marland Kitchen VITAMIN K PO Take 150 mcg by mouth. Vitamin K2 MK-7     No current facility-administered medications for this visit.     ALLERGIES: Codeine, Papaya derivatives, Plum pulp, Sulfamethoxazole, Sulfonamide derivatives, and Talwin [pentazocine]  Family History  Problem Relation Age of Onset  . Osteoporosis Mother   . Fibroids Mother   . Hypertension Mother   . Colitis Mother   . Hyperlipidemia Mother   . Thyroid disease Mother   . COPD Mother   . Heart failure Mother   . Fibroids Sister   . Dementia Father   . Thyroid disease Brother   . Hyperlipidemia Brother   . Osteoporosis Maternal Grandmother   . Emphysema Maternal Grandfather   . Pulmonary disease Maternal Grandfather     Social History   Socioeconomic  History  . Marital status: Married    Spouse name: Not on file  . Number of children: Not on file  . Years of education: Not on file  . Highest education level: Not on file  Occupational History  . Not on file  Tobacco Use  . Smoking status: Never Smoker  . Smokeless tobacco: Never Used  Vaping Use  . Vaping Use: Never used  Substance and Sexual Activity  . Alcohol use: No  . Drug use: No  . Sexual activity: Yes    Partners: Male    Birth control/protection: Post-menopausal  Other Topics Concern  . Not on file  Social History Narrative  . Not on file   Social Determinants of Health   Financial Resource Strain:   . Difficulty of Paying Living Expenses:   Food Insecurity:   . Worried About Programme researcher, broadcasting/film/video in the Last Year:   . Barista in the Last Year:   Transportation Needs:   . Freight forwarder (Medical):   Marland Kitchen Lack of Transportation (Non-Medical):   Physical Activity:   . Days of Exercise per Week:   . Minutes of Exercise per Session:   Stress:   . Feeling of Stress :   Social Connections:   . Frequency of Communication with Friends and Family:   . Frequency of Social Gatherings with Friends and Family:   . Attends Religious Services:   . Active Member of Clubs or Organizations:   . Attends Banker Meetings:   Marland Kitchen Marital Status:   Intimate Partner Violence:   . Fear of Current or Ex-Partner:   . Emotionally Abused:   Marland Kitchen Physically Abused:   . Sexually Abused:     Review of Systems  All other systems reviewed and are negative.   PHYSICAL EXAMINATION:    BP 122/68   Pulse 90   Temp 98.2 F (36.8 C)   Ht 5' 6.5" (1.689 m)   Wt 233 lb (105.7 kg)   LMP 02/16/2015 (Approximate)   SpO2 99%   BMI 37.04 kg/m     General appearance: alert, cooperative and appears stated age  Ultrasound images are normal, reviewed with patient. Thin endometrial stripe  ASSESSMENT PMP spotting on HRT, very thin uniform endometrium     PLAN Call with further bleeding

## 2020-11-28 ENCOUNTER — Encounter: Payer: Self-pay | Admitting: Obstetrics and Gynecology

## 2020-12-19 ENCOUNTER — Encounter: Payer: Self-pay | Admitting: Obstetrics and Gynecology

## 2021-04-17 NOTE — Progress Notes (Signed)
60 y.o. G0P0000 Married White or Caucasian Not Hispanic or Latino female here for annual exam.  No vaginal bleeding. No dyspareunia. No bowel or bladder c/o. No medical change.    She is on HRT, testosterone and thyroid medication through [facility]. They are monitoring her levels.  Patient's last menstrual period was 02/16/2015 (approximate).          Sexually active: Yes.    The current method of family planning is post menopausal status.    Exercising: Yes.    Walking  Smoker:  no  Health Maintenance: Pap:6/3/21WNL Hr Hpv Neg   02-02-17 WNL NEG HR HPV 01-22-16 WNL NEG HR HPV History of abnormal Pap: Yeshad colposcopy and LEEP in 2000   MMG:  12/19/20 Bi-rads 1 neg  BMD:   None  Colonoscopy: 08/17/2013 f/u 10 years  TDaP:  04/15/2019  Gardasil: NA   reports that she has never smoked. She has never used smokeless tobacco. She reports that she does not drink alcohol and does not use drugs. 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 Husband is a Ambulance person (Husband had a staph infection and lost all of his toes on one foot)  Past Medical History:  Diagnosis Date  . Chronic vulvitis 08/1990  . 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 Fibrocystic breast 10/1991  . Hashimoto's disease 2015    Past Surgical History:  Procedure Laterality Date  . CERVICAL BIOPSY  W/ LOOP ELECTRODE EXCISION  01/1999  . COLPOSCOPY  12/1998  . KNEE ARTHROSCOPY Right 12/2006  . KNEE ARTHROSCOPY Right 05/2010  . KNEE RECONSTRUCTION Right 1996  . REPAIR PERONEAL TENDONS ANKLE Left 06/2009  . SHOULDER ARTHROSCOPY Right 2002  . TONSILLECTOMY AND ADENOIDECTOMY  1968    Current Outpatient Medications  Medication Sig Dispense Refill  . albuterol (PROAIR HFA) 108 (90 BASE) MCG/ACT inhaler Inhale two puffs every four to six hours as needed for cough or wheeze.  Can use two puffs 15 minutes  prior to exercise if needed. 3 Inhaler 0  . Cholecalciferol (VITAMIN D PO) Take 5,000 Units by mouth 2 (two) times daily.    Marland Kitchen DHEA 25 MG CAPS Take 1 capsule by mouth 2 (two) times a week.    . estradiol (CLIMARA - DOSED IN MG/24 HR) 0.05 mg/24hr patch Place 0.05 mg onto the skin once a week.    . fluticasone (FLONASE) 50 MCG/ACT nasal spray Use one spray in each nostril twice daily to prevent runny nose and congestion. 48 g 1  . Ginger, Zingiber officinalis, (GINGER ROOT) 550 MG CAPS Take by mouth.    . IRON PO Take 18 mg by mouth.    . Methylcobalamin (METHYL B-12 PO) Take 1,000 mcg by mouth once a week.     . Multiple Vitamins-Minerals (ZINC PO) Take 50 mg by mouth. Solaray Biocitrate Zince.    . Nutritional Supplements (NUTRITIONAL SUPPLEMENT PO) Take 400 mg by mouth. Turmeric    . Omega-3 Fatty Acids (OMEGA 3 PO) Take by mouth.    Marland Kitchen PRESCRIPTION MEDICATION daily. T4- AND T3- . Compound medication.    Marland Kitchen PRESCRIPTION MEDICATION 1 application 2 (two) times a week. Testosterone 2% Cream.    . Probiotic Product (PROBIOTIC DAILY PO) Take by mouth daily.    . progesterone (PROMETRIUM) 100 MG capsule Take 1 capsule by mouth daily.   3  . vitamin A 78295 UNIT capsule Take  25,000 Units by mouth.    Marland Kitchen VITAMIN K PO Take 150 mcg by mouth. Vitamin K2 MK-7     No current facility-administered medications for this visit.    Family History  Problem Relation Age of Onset  . Osteoporosis Mother   . Fibroids Mother   . Hypertension Mother   . Colitis Mother   . Hyperlipidemia Mother   . Thyroid disease Mother   . COPD Mother   . Heart failure Mother   . Fibroids Sister   . Dementia Father   . Thyroid disease Brother   . Hyperlipidemia Brother   . Osteoporosis Maternal Grandmother   . Emphysema Maternal Grandfather   . Pulmonary disease Maternal Grandfather     Review of Systems  All other systems reviewed and are negative.   Exam:   BP 122/72   Pulse 71   Ht 5\' 6"  (1.676  m)   Wt 245 lb (111.1 kg)   LMP 02/16/2015 (Approximate)   SpO2 99%   BMI 39.54 kg/m   Weight change: @WEIGHTCHANGE @ Height:   Height: 5\' 6"  (167.6 cm)  Ht Readings from Last 3 Encounters:  04/24/21 5\' 6"  (1.676 m)  05/01/20 5' 6.5" (1.689 m)  04/19/20 5' 6.5" (1.689 m)    General appearance: alert, cooperative and appears stated age Head: Normocephalic, without obvious abnormality, atraumatic Neck: no adenopathy, supple, symmetrical, trachea midline and thyroid normal to inspection and palpation Lungs: clear to auscultation bilaterally Cardiovascular: regular rate and rhythm Breasts: normal appearance, no masses or tenderness Abdomen: soft, non-tender; non distended,  no masses,  no organomegaly Extremities: extremities normal, atraumatic, no cyanosis or edema Skin: Skin color, texture, turgor normal. No rashes or lesions Lymph nodes: Cervical, supraclavicular, and axillary nodes normal. No abnormal inguinal nodes palpated Neurologic: Grossly normal   Pelvic: External genitalia:  no lesions              Urethra:  normal appearing urethra with no masses, tenderness or lesions              Bartholins and Skenes: normal                 Vagina: normal appearing vagina with normal color and discharge, no lesions              Cervix: no lesions               Bimanual Exam:  Uterus:  normal size, contour, position, consistency, mobility, non-tender              Adnexa: no mass, fullness, tenderness               Rectovaginal: Confirms               Anus:  normal sphincter tone, no lesions  05/03/20 chaperoned for the exam.   1. Well woman exam Discussed breast self exam Discussed calcium and vit D intake Gets lab work at 03-31-1998  2. Hormone replacement therapy (HRT) Managed at [facility]

## 2021-04-24 ENCOUNTER — Encounter: Payer: Self-pay | Admitting: Obstetrics and Gynecology

## 2021-04-24 ENCOUNTER — Ambulatory Visit: Payer: 59 | Admitting: Obstetrics and Gynecology

## 2021-04-24 ENCOUNTER — Other Ambulatory Visit: Payer: Self-pay

## 2021-04-24 ENCOUNTER — Ambulatory Visit (INDEPENDENT_AMBULATORY_CARE_PROVIDER_SITE_OTHER): Payer: 59 | Admitting: Obstetrics and Gynecology

## 2021-04-24 VITALS — BP 122/72 | HR 71 | Ht 66.0 in | Wt 245.0 lb

## 2021-04-24 DIAGNOSIS — K219 Gastro-esophageal reflux disease without esophagitis: Secondary | ICD-10-CM | POA: Insufficient documentation

## 2021-04-24 DIAGNOSIS — Z01419 Encounter for gynecological examination (general) (routine) without abnormal findings: Secondary | ICD-10-CM | POA: Diagnosis not present

## 2021-04-24 DIAGNOSIS — Z79890 Hormone replacement therapy (postmenopausal): Secondary | ICD-10-CM

## 2021-04-24 DIAGNOSIS — Z1211 Encounter for screening for malignant neoplasm of colon: Secondary | ICD-10-CM | POA: Insufficient documentation

## 2021-04-24 NOTE — Patient Instructions (Signed)

## 2021-12-05 ENCOUNTER — Encounter: Payer: Self-pay | Admitting: Obstetrics and Gynecology

## 2022-03-17 HISTORY — PX: TOTAL KNEE ARTHROPLASTY: SHX125

## 2022-04-07 DIAGNOSIS — Z96651 Presence of right artificial knee joint: Secondary | ICD-10-CM | POA: Insufficient documentation

## 2022-04-07 DIAGNOSIS — M25661 Stiffness of right knee, not elsewhere classified: Secondary | ICD-10-CM | POA: Insufficient documentation

## 2022-05-01 ENCOUNTER — Ambulatory Visit: Payer: 59 | Admitting: Obstetrics and Gynecology

## 2022-12-15 ENCOUNTER — Encounter: Payer: Self-pay | Admitting: Obstetrics and Gynecology

## 2023-04-22 NOTE — Progress Notes (Signed)
62 y.o. G0P0000 Married White or Caucasian Not Hispanic or Latino female here for annual exam.     She is on HRT, testosterone and thyroid medication through [facility]. They are monitoring her levels. They increased her estrogen patch last month and she had some spotting.  No significant vasomotor symptoms. Sexually active, no pain.   Patient's last menstrual period was 02/16/2015 (approximate).          Sexually active: Yes.    The current method of family planning is post menopausal status.    Exercising: No.     Smoker:  no  Health Maintenance: Pap:  04/19/20 neg: HR HPV neg, 04/15/19 neg: HR HPV neg History of abnormal Pap:  yes, colpo and LEEP in 2000 MMG:  12/10/22 Breast Density Cat B, BI-RADS CAT 1 neg BMD:   n/a Colonoscopy: 08/17/2013 f/u 10 years, she will schedule a f/u.  TDaP:  04/15/19 Gardasil: n/a   reports that she has never smoked. She has never used smokeless tobacco. She reports that she does not drink alcohol and does not use drugs. 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 Husband is a Engineer, civil (consulting).   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   Fibrocystic breast 10/1991   Hashimoto's disease 2015    Past Surgical History:  Procedure Laterality Date   CERVICAL BIOPSY  W/ LOOP ELECTRODE EXCISION  01/16/1999   COLPOSCOPY  12/18/1998   KNEE ARTHROSCOPY Right 12/18/2006   KNEE ARTHROSCOPY Right 05/17/2010   KNEE RECONSTRUCTION Right 11/17/1994   REPAIR PERONEAL TENDONS ANKLE Left 06/17/2009   SHOULDER ARTHROSCOPY Right 11/17/2000   TONSILLECTOMY AND ADENOIDECTOMY  11/17/1966   TOTAL KNEE ARTHROPLASTY Right 03/2022    Current Outpatient Medications  Medication Sig Dispense Refill   albuterol (PROAIR HFA) 108 (90 BASE) MCG/ACT inhaler Inhale two puffs every four to six hours as needed  for cough or wheeze.  Can use two puffs 15 minutes prior to exercise if needed. 3 Inhaler 0   ARMOUR THYROID 90 MG tablet Take by mouth.     Cholecalciferol (VITAMIN D PO) Take 5,000 Units by mouth 2 (two) times daily.     DHEA 25 MG CAPS Take 1 capsule by mouth 2 (two) times a week.     estradiol (CLIMARA - DOSED IN MG/24 HR) 0.05 mg/24hr patch Place 0.05 mg onto the skin once a week.     fluticasone (FLONASE) 50 MCG/ACT nasal spray Use one spray in each nostril twice daily to prevent runny nose and congestion. 48 g 1   Ginger, Zingiber officinalis, (GINGER ROOT) 550 MG CAPS Take by mouth.     IRON PO Take 18 mg by mouth.     Methylcobalamin (METHYL B-12 PO) Take 1,000 mcg by mouth once a week.      Multiple Vitamins-Minerals (ZINC PO) Take 50 mg by mouth. Solaray Biocitrate Zince.     Nutritional Supplements (NUTRITIONAL SUPPLEMENT PO) Take 400 mg by mouth. Turmeric     Omega-3 Fatty Acids (OMEGA 3 PO) Take by mouth.     PRESCRIPTION MEDICATION 1 application 2 (two) times a week. Testosterone 2% Cream.     Probiotic Product (PROBIOTIC DAILY PO) Take by mouth daily.     progesterone (PROMETRIUM) 100 MG capsule Take 1 capsule by mouth daily.   3   progesterone (PROMETRIUM) 100 MG  capsule Take 100 mg (1 capsule) by mouth every other day alternating with 200 mg (2 capsules) by mouth every other day     vitamin A 96045 UNIT capsule Take 25,000 Units by mouth.     VITAMIN K PO Take 150 mcg by mouth. Vitamin K2 MK-7     PRESCRIPTION MEDICATION daily. T4- AND T3- . Compound medication. (Patient not taking: Reported on 05/01/2023)     No current facility-administered medications for this visit.    Family History  Problem Relation Age of Onset   Osteoporosis Mother    Fibroids Mother    Hypertension Mother    Colitis Mother    Hyperlipidemia Mother    Thyroid disease Mother    COPD Mother    Heart failure Mother    Fibroids Sister    Dementia Father    Thyroid disease Brother     Hyperlipidemia Brother    Osteoporosis Maternal Grandmother    Emphysema Maternal Grandfather    Pulmonary disease Maternal Grandfather     Review of Systems  All other systems reviewed and are negative.   Exam:   BP 126/82 (BP Location: Right Arm, Patient Position: Sitting, Cuff Size: Large)   Pulse 79   Ht 5\' 7"  (1.702 m)   Wt 263 lb (119.3 kg)   LMP 02/16/2015 (Approximate)   SpO2 99%   BMI 41.19 kg/m   Weight change: @WEIGHTCHANGE @ Height:   Height: 5\' 7"  (170.2 cm)  Ht Readings from Last 3 Encounters:  05/01/23 5\' 7"  (1.702 m)  04/24/21 5\' 6"  (1.676 m)  05/01/20 5' 6.5" (1.689 m)    General appearance: alert, cooperative and appears stated age Head: Normocephalic, without obvious abnormality, atraumatic Neck: no adenopathy, supple, symmetrical, trachea midline and thyroid normal to inspection and palpation Lungs: clear to auscultation bilaterally Cardiovascular: regular rate and rhythm Breasts: normal appearance, no masses or tenderness Abdomen: soft, non-tender; non distended,  no masses,  no organomegaly Extremities: extremities normal, atraumatic, no cyanosis or edema Skin: Skin color, texture, turgor normal. No rashes or lesions Lymph nodes: Cervical, supraclavicular, and axillary nodes normal. No abnormal inguinal nodes palpated Neurologic: Grossly normal   Pelvic: External genitalia:  no lesions              Urethra:  normal appearing urethra with no masses, tenderness or lesions              Bartholins and Skenes: normal                 Vagina: normal appearing vagina with normal color and discharge, no lesions              Cervix: no lesions               Bimanual Exam:  Uterus:   no masses or tenderness              Adnexa: no mass, fullness, tenderness               Rectovaginal: Confirms               Anus:  normal sphincter tone, no lesions  Kristin Bruins, CMA chaperoned for the exam.  1. Well woman exam Discussed breast self exam Discussed  calcium and vit D intake Labs with [facility] UTD Colonoscopy due in 10/24, she will schedule  2. Screening for cervical cancer - Cytology - PAP  3. Postmenopausal bleeding - US PELVIS TRANSVAGINAL NON-OB (TV ONLY); Future -Return  for u/s and MD visit, if stripe is thickened she will need a biopsy

## 2023-05-01 ENCOUNTER — Ambulatory Visit (INDEPENDENT_AMBULATORY_CARE_PROVIDER_SITE_OTHER): Payer: 59 | Admitting: Obstetrics and Gynecology

## 2023-05-01 ENCOUNTER — Other Ambulatory Visit (HOSPITAL_COMMUNITY)
Admission: RE | Admit: 2023-05-01 | Discharge: 2023-05-01 | Disposition: A | Discharge status: Home / Self Care | Payer: 59 | Source: Ambulatory Visit | Attending: Obstetrics and Gynecology | Admitting: Obstetrics and Gynecology

## 2023-05-01 ENCOUNTER — Encounter: Payer: Self-pay | Admitting: Obstetrics and Gynecology

## 2023-05-01 VITALS — BP 126/82 | HR 79 | Ht 67.0 in | Wt 263.0 lb

## 2023-05-01 DIAGNOSIS — N950 Postmenopausal bleeding: Secondary | ICD-10-CM | POA: Diagnosis not present

## 2023-05-01 DIAGNOSIS — Z124 Encounter for screening for malignant neoplasm of cervix: Secondary | ICD-10-CM | POA: Insufficient documentation

## 2023-05-01 DIAGNOSIS — Z01419 Encounter for gynecological examination (general) (routine) without abnormal findings: Secondary | ICD-10-CM

## 2023-05-01 DIAGNOSIS — Z1211 Encounter for screening for malignant neoplasm of colon: Secondary | ICD-10-CM | POA: Insufficient documentation

## 2023-05-01 NOTE — Patient Instructions (Signed)

## 2023-05-04 LAB — CYTOLOGY - PAP
Comment: NEGATIVE
Diagnosis: NEGATIVE
High risk HPV: NEGATIVE

## 2023-05-05 ENCOUNTER — Other Ambulatory Visit: Payer: Self-pay

## 2023-05-05 ENCOUNTER — Telehealth: Payer: Self-pay

## 2023-05-05 MED ORDER — FLUCONAZOLE 150 MG PO TABS: ORAL_TABLET | ORAL | 0 refills | Status: DC

## 2023-05-05 NOTE — Telephone Encounter (Signed)
Patient notified of results.

## 2023-05-05 NOTE — Telephone Encounter (Signed)
-----   Message from Romualdo Bolk, MD sent at 05/05/2023  8:47 AM EDT ----- Please let the patient know that her pap is normal. They did see yeast, this only needs to be treated if she is symptomatic. If symptomatic treat with diflucan 150 mg x 1, may repeat in 72 hours if still symptomatic. #2, no refills  Routine pap f/u

## 2023-05-05 NOTE — Telephone Encounter (Signed)
Left message for call back.

## 2023-05-26 ENCOUNTER — Ambulatory Visit (INDEPENDENT_AMBULATORY_CARE_PROVIDER_SITE_OTHER): Payer: 59

## 2023-05-26 DIAGNOSIS — N950 Postmenopausal bleeding: Secondary | ICD-10-CM

## 2023-06-23 ENCOUNTER — Other Ambulatory Visit: Payer: 59

## 2023-06-23 ENCOUNTER — Other Ambulatory Visit: Payer: 59 | Admitting: Radiology

## 2023-09-03 ENCOUNTER — Telehealth: Payer: Self-pay | Admitting: *Deleted

## 2023-09-03 NOTE — Telephone Encounter (Signed)
Spoke with patient, advised per Dr. Karma Greaser.  Pre-op scheduled for 09/17/23 at 1400.   Reviewed possible surgery dates, will hold 10/09/23. Patient is aware I will proceed with scheduling and f/u once pre-op completed.   Questions answered.   Routing to provider for final review. Patient is agreeable to disposition. Will close encounter.

## 2023-09-03 NOTE — Telephone Encounter (Signed)
Duplicate encounter. Encounter closed.

## 2023-09-03 NOTE — Telephone Encounter (Signed)
Patient left message on triage line requesting return call regarding removal of cervical polyp recommended by Dr. Oscar La from 05/26/23 PUS.  Patient states she was not called back to schedule. Expressed frustration with delay.    Call returned to patient, states she is in a meeting, will return call to further discuss.  Dr. Karma Greaser -can you review previous imaging and advise if OV needed first?

## 2023-09-17 ENCOUNTER — Encounter: Payer: Self-pay | Admitting: Obstetrics and Gynecology

## 2023-09-17 ENCOUNTER — Ambulatory Visit (INDEPENDENT_AMBULATORY_CARE_PROVIDER_SITE_OTHER): Payer: 59 | Admitting: Obstetrics and Gynecology

## 2023-09-17 VITALS — BP 120/64 | HR 75 | Wt 273.0 lb

## 2023-09-17 DIAGNOSIS — Z1211 Encounter for screening for malignant neoplasm of colon: Secondary | ICD-10-CM

## 2023-09-17 DIAGNOSIS — N841 Polyp of cervix uteri: Secondary | ICD-10-CM

## 2023-09-17 NOTE — Progress Notes (Signed)
62 y.o. G0P0000 Married White or Caucasian Not Hispanic or Latino female here for preop H&P for myosure D&C, ECC For endocervical polyp removal  US PELVIS TRANSVAGINAL NON-OB (TV ONLY) (Accession [id number]) (Order [id number]) Imaging Date: 05/26/2023 Department: [facility] Released By: Lillia Carmel, CMA Authorizing: Romualdo Bolk, MD   Exam Status  Status  Final [99]   PACS Intelerad Image Link   Show images for US PELVIS TRANSVAGINAL NON-OB (TV ONLY) Study Result  Narrative & Impression  Indication: PMB on HRT   Vaginal u/s   Anteverted uterus normal size and shape No myometrial masses Endometrial thickness 2.2mm   6mm echogenic mass with feeder vessel protrudes into cervical canalc/w cervical polyp   Both ovaries atrophic No adnexal masses or free fluid seen   Impression: cervical polyp     She is on HRT, testosterone and thyroid medication through [facility]. They are monitoring her levels. They increased her estrogen patch last month and she had some spotting.  No significant vasomotor symptoms. Sexually active, no pain.   Patient's last menstrual period was 02/16/2015 (approximate).          Sexually active: Yes.    The current method of family planning is post menopausal status.    Exercising: No.     Smoker:  no  Health Maintenance: Pap:  04/19/20 neg: HR HPV neg, 04/15/19 neg: HR HPV neg, pap normal 2024 History of abnormal Pap:  yes, colpo and LEEP in 2000 MMG:  12/10/22 Breast Density Cat B, BI-RADS CAT 1 neg BMD:   n/a Colonoscopy: 08/17/2013 f/u 10 years, she will schedule a f/u.  TDaP:  04/15/19 Gardasil: n/a  US PELVIS TRANSVAGINAL NON-OB (TV ONLY) (Accession [id number]) (Order [id number]) Imaging Date: 05/26/2023 Department: [facility] Imaging Released By: Lillia Carmel, CMA Authorizing: Romualdo Bolk, MD   Exam Status  Status  Final [99]   PACS Intelerad Image Link    Show images for US PELVIS TRANSVAGINAL NON-OB (TV ONLY) Study Result  Narrative & Impression  Indication: PMB on HRT   Vaginal u/s   Anteverted uterus normal size and shape No myometrial masses Endometrial thickness 2.38mm   6mm echogenic mass with feeder vessel protrudes into cervical canalc/w cervical polyp   Both ovaries atrophic No adnexal masses or free fluid seen   Impression: cervical polyp       Component Value Date/Time   DIAGPAP  05/01/2023 0833    - Negative for intraepithelial lesion or malignancy (NILM)   DIAGPAP  04/19/2020 1013    - Negative for intraepithelial lesion or malignancy (NILM)   DIAGPAP  04/15/2019 0000    NEGATIVE FOR INTRAEPITHELIAL LESIONS OR MALIGNANCY.   HPVHIGH Negative 05/01/2023 0833   HPVHIGH Negative 04/19/2020 1013   ADEQPAP  05/01/2023 0833    Satisfactory for evaluation; transformation zone component PRESENT.   ADEQPAP  04/19/2020 1013    Satisfactory for evaluation; transformation zone component ABSENT.   ADEQPAP  04/15/2019 0000    Satisfactory for evaluation  endocervical/transformation zone component PRESENT.    High Risk HPV: Positive  Adequacy:  Satisfactory for evaluation, transformation zone component PRESENT  Diagnosis:  Atypical squamous cells of undetermined significance (ASC-US)   reports that she has never smoked. She has never used smokeless tobacco. She reports that she does not drink alcohol and does not use drugs. 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 Husband is a Engineer, civil (consulting).   Past Medical History:  Diagnosis Date  . Chronic vulvitis 08/1990  . 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 Fibrocystic breast 10/1991  . Hashimoto's disease 2015    Past Surgical History:  Procedure Laterality Date  . CERVICAL BIOPSY  W/ LOOP ELECTRODE EXCISION  01/16/1999  . COLPOSCOPY  12/18/1998  . KNEE ARTHROSCOPY  Right 12/18/2006  . KNEE ARTHROSCOPY Right 05/17/2010  . KNEE RECONSTRUCTION Right 11/17/1994  . REPAIR PERONEAL TENDONS ANKLE Left 06/17/2009  . SHOULDER ARTHROSCOPY Right 11/17/2000  . TONSILLECTOMY AND ADENOIDECTOMY  11/17/1966  . TOTAL KNEE ARTHROPLASTY Right 03/2022    Current Outpatient Medications  Medication Sig Dispense Refill  . albuterol (PROAIR HFA) 108 (90 BASE) MCG/ACT inhaler Inhale two puffs every four to six hours as needed for cough or wheeze.  Can use two puffs 15 minutes prior to exercise if needed. 3 Inhaler 0  . ARMOUR THYROID 90 MG tablet Take by mouth.    . Cholecalciferol (VITAMIN D PO) Take 5,000 Units by mouth 2 (two) times daily.    Marland Kitchen DHEA 25 MG CAPS Take 1 capsule by mouth 2 (two) times a week.    . estradiol (CLIMARA - DOSED IN MG/24 HR) 0.05 mg/24hr patch Place 0.05 mg onto the skin once a week.    . fluconazole (DIFLUCAN) 150 MG tablet Take 1 by mouth & repeat 1 tablet in 3 days if still having symptoms 2 tablet 0  . fluticasone (FLONASE) 50 MCG/ACT nasal spray Use one spray in each nostril twice daily to prevent runny nose and congestion. 48 g 1  . Ginger, Zingiber officinalis, (GINGER ROOT) 550 MG CAPS Take by mouth.    . IRON PO Take 18 mg by mouth.    . Methylcobalamin (METHYL B-12 PO) Take 1,000 mcg by mouth once a week.     . Multiple Vitamins-Minerals (ZINC PO) Take 50 mg by mouth. Solaray Biocitrate Zince.    . Nutritional Supplements (NUTRITIONAL SUPPLEMENT PO) Take 400 mg by mouth. Turmeric    . Omega-3 Fatty Acids (OMEGA 3 PO) Take by mouth.    Marland Kitchen PRESCRIPTION MEDICATION daily. T4- AND T3- . Compound medication. (Patient not taking: Reported on 05/01/2023)    . PRESCRIPTION MEDICATION 1 application 2 (two) times a week. Testosterone 2% Cream.    . Probiotic Product (PROBIOTIC DAILY PO) Take by mouth daily.    . progesterone (PROMETRIUM) 100 MG capsule Take 1 capsule by mouth daily.   3  . progesterone (PROMETRIUM) 100 MG capsule Take  100 mg (1 capsule) by mouth every other day alternating with 200 mg (2 capsules) by mouth every other day    . vitamin A 16109 UNIT capsule Take 25,000 Units by mouth.    Marland Kitchen VITAMIN K PO Take 150 mcg by mouth. Vitamin K2 MK-7     No current facility-administered medications for this visit.    Family History  Problem Relation Age of Onset  . Osteoporosis Mother   . Fibroids Mother   . Hypertension Mother   . Colitis Mother   . Hyperlipidemia Mother   . Thyroid disease Mother   . COPD Mother   . Heart failure Mother   . Fibroids Sister   . Dementia Father   . Thyroid disease Brother   . Hyperlipidemia Brother   . Osteoporosis Maternal Grandmother   . Emphysema Maternal Grandfather   . Pulmonary disease Maternal Grandfather     Review of Systems  All other systems reviewed and are negative.   Exam:   LMP 02/16/2015 (Approximate)   Weight change: @WEIGHTCHANGE @ Height:      Ht Readings from Last 3 Encounters:  05/01/23 5\' 7"  (1.702 m)  04/24/21 5\' 6"  (1.676 m)  05/01/20 5' 6.5" (1.689 m)    General appearance: alert, cooperative and appears stated age Neck: no adenopathy, supple, symmetrical, trachea midline and thyroid normal to inspection and palpation Lungs: clear to auscultation bilaterally Cardiovascular: regular rate and rhythm Abdomen: soft, non-tender; non distended,  no masses,  no organomegaly Extremities: extremities normal, atraumatic, no cyanosis or edema   PM endocervical polyp seen on [place] for Myosure D&C, ECC removal  The hysteroscopy with the myosure D&C, ECC was reviewed and discussed in detail with the patient.  The risk of bleeding, infection, injury to surrounding structures, uterine perforation with risk of additional procedure laparoscopy/laparotomy were all reviewed discussed with patient.  Risk for blood clots was discussed.  Discussed risk for bleeding after the procedure anywhere from 7 to 10 days.  She will need a driver to and from surgery as well.   Pathology results will take about 2 weeks to return.

## 2023-09-17 NOTE — H&P (View-Only) (Signed)
62 y.o. G0P0000 Married White or Caucasian Not Hispanic or Latino female here for preop H&P for myosure D&C, ECC For endocervical polyp removal  US PELVIS TRANSVAGINAL NON-OB (TV ONLY) (Accession [id number]) (Order [id number]) Imaging Date: 05/26/2023 Department: [facility] Imaging Released By: Lillia Carmel, CMA Authorizing: Romualdo Bolk, MD   Exam Status  Status  Final [99]   PACS Intelerad Image Link   Show images for US PELVIS TRANSVAGINAL NON-OB (TV ONLY) Study Result  Narrative & Impression  Indication: PMB on HRT   Vaginal u/s   Anteverted uterus normal size and shape No myometrial masses Endometrial thickness 2.43mm   6mm echogenic mass with feeder vessel protrudes into cervical canalc/w cervical polyp   Both ovaries atrophic No adnexal masses or free fluid seen   Impression: cervical polyp     She is on HRT, testosterone and thyroid medication through [facility]. They are monitoring her levels. They increased her estrogen patch last month and she had some spotting.  No significant vasomotor symptoms. Sexually active, no pain.   Patient's last menstrual period was 02/16/2015 (approximate).          Sexually active: Yes.    The current method of family planning is post menopausal status.    Exercising: No.     Smoker:  no  Health Maintenance: Pap:  04/19/20 neg: HR HPV neg, 04/15/19 neg: HR HPV neg, pap normal 2024 History of abnormal Pap:  yes, colpo and LEEP in 2000 MMG:  12/10/22 Breast Density Cat B, BI-RADS CAT 1 neg BMD:   n/a Colonoscopy: 08/17/2013 f/u 10 years, she will schedule a f/u.  TDaP:  04/15/19 Gardasil: n/a  US PELVIS TRANSVAGINAL NON-OB (TV ONLY) (Accession [id number]) (Order [id number]) Imaging Date: 05/26/2023 Department: [facility] Imaging Released By: Lillia Carmel, CMA Authorizing: Romualdo Bolk, MD   Exam Status  Status  Final [99]   PACS Intelerad Image Link    Show images for US PELVIS TRANSVAGINAL NON-OB (TV ONLY) Study Result  Narrative & Impression  Indication: PMB on HRT   Vaginal u/s   Anteverted uterus normal size and shape No myometrial masses Endometrial thickness 2.11mm   6mm echogenic mass with feeder vessel protrudes into cervical canalc/w cervical polyp   Both ovaries atrophic No adnexal masses or free fluid seen   Impression: cervical polyp       Component Value Date/Time   DIAGPAP  05/01/2023 0833    - Negative for intraepithelial lesion or malignancy (NILM)   DIAGPAP  04/19/2020 1013    - Negative for intraepithelial lesion or malignancy (NILM)   DIAGPAP  04/15/2019 0000    NEGATIVE FOR INTRAEPITHELIAL LESIONS OR MALIGNANCY.   HPVHIGH Negative 05/01/2023 0833   HPVHIGH Negative 04/19/2020 1013   ADEQPAP  05/01/2023 0833    Satisfactory for evaluation; transformation zone component PRESENT.   ADEQPAP  04/19/2020 1013    Satisfactory for evaluation; transformation zone component ABSENT.   ADEQPAP  04/15/2019 0000    Satisfactory for evaluation  endocervical/transformation zone component PRESENT.    High Risk HPV: Positive  Adequacy:  Satisfactory for evaluation, transformation zone component PRESENT  Diagnosis:  Atypical squamous cells of undetermined significance (ASC-US)   reports that she has never smoked. She has never used smokeless tobacco. She reports that she does not drink alcohol and does not use drugs. 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   Past Medical History:  Diagnosis Date  . Chronic vulvitis 08/1990  . 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 Fibrocystic breast 10/1991  . Hashimoto's disease 2015    Past Surgical History:  Procedure Laterality Date  . CERVICAL BIOPSY  W/ LOOP ELECTRODE EXCISION  01/16/1999  . COLPOSCOPY  12/18/1998  . KNEE ARTHROSCOPY  Right 12/18/2006  . KNEE ARTHROSCOPY Right 05/17/2010  . KNEE RECONSTRUCTION Right 11/17/1994  . REPAIR PERONEAL TENDONS ANKLE Left 06/17/2009  . SHOULDER ARTHROSCOPY Right 11/17/2000  . TONSILLECTOMY AND ADENOIDECTOMY  11/17/1966  . TOTAL KNEE ARTHROPLASTY Right 03/2022    Current Outpatient Medications  Medication Sig Dispense Refill  . albuterol (PROAIR HFA) 108 (90 BASE) MCG/ACT inhaler Inhale two puffs every four to six hours as needed for cough or wheeze.  Can use two puffs 15 minutes prior to exercise if needed. 3 Inhaler 0  . ARMOUR THYROID 90 MG tablet Take by mouth.    . Cholecalciferol (VITAMIN D PO) Take 5,000 Units by mouth 2 (two) times daily.    Marland Kitchen DHEA 25 MG CAPS Take 1 capsule by mouth 2 (two) times a week.    . estradiol (CLIMARA - DOSED IN MG/24 HR) 0.05 mg/24hr patch Place 0.05 mg onto the skin once a week.    . fluconazole (DIFLUCAN) 150 MG tablet Take 1 by mouth & repeat 1 tablet in 3 days if still having symptoms 2 tablet 0  . fluticasone (FLONASE) 50 MCG/ACT nasal spray Use one spray in each nostril twice daily to prevent runny nose and congestion. 48 g 1  . Ginger, Zingiber officinalis, (GINGER ROOT) 550 MG CAPS Take by mouth.    . IRON PO Take 18 mg by mouth.    . Methylcobalamin (METHYL B-12 PO) Take 1,000 mcg by mouth once a week.     . Multiple Vitamins-Minerals (ZINC PO) Take 50 mg by mouth. Solaray Biocitrate Zince.    . Nutritional Supplements (NUTRITIONAL SUPPLEMENT PO) Take 400 mg by mouth. Turmeric    . Omega-3 Fatty Acids (OMEGA 3 PO) Take by mouth.    Marland Kitchen PRESCRIPTION MEDICATION daily. T4- AND T3- . Compound medication. (Patient not taking: Reported on 05/01/2023)    . PRESCRIPTION MEDICATION 1 application 2 (two) times a week. Testosterone 2% Cream.    . Probiotic Product (PROBIOTIC DAILY PO) Take by mouth daily.    . progesterone (PROMETRIUM) 100 MG capsule Take 1 capsule by mouth daily.   3  . progesterone (PROMETRIUM) 100 MG capsule Take  100 mg (1 capsule) by mouth every other day alternating with 200 mg (2 capsules) by mouth every other day    . vitamin A 57846 UNIT capsule Take 25,000 Units by mouth.    Marland Kitchen VITAMIN K PO Take 150 mcg by mouth. Vitamin K2 MK-7     No current facility-administered medications for this visit.    Family History  Problem Relation Age of Onset  . Osteoporosis Mother   . Fibroids Mother   . Hypertension Mother   . Colitis Mother   . Hyperlipidemia Mother   . Thyroid disease Mother   . COPD Mother   . Heart failure Mother   . Fibroids Sister   . Dementia Father   . Thyroid disease Brother   . Hyperlipidemia Brother   . Osteoporosis Maternal Grandmother   . Emphysema Maternal Grandfather   . Pulmonary disease Maternal Grandfather     Review of Systems  All other systems reviewed and are negative.   Exam:   LMP 02/16/2015 (Approximate)   Weight change: @WEIGHTCHANGE @ Height:      Ht Readings from Last 3 Encounters:  05/01/23 5\' 7"  (1.702 m)  04/24/21 5\' 6"  (1.676 m)  05/01/20 5' 6.5" (1.689 m)    General appearance: alert, cooperative and appears stated age Neck: no adenopathy, supple, symmetrical, trachea midline and thyroid normal to inspection and palpation Lungs: clear to auscultation bilaterally Cardiovascular: regular rate and rhythm Abdomen: soft, non-tender; non distended,  no masses,  no organomegaly Extremities: extremities normal, atraumatic, no cyanosis or edema   PM endocervical polyp seen on [place] for Myosure D&C, ECC removal  The hysteroscopy with the myosure D&C, ECC was reviewed and discussed in detail with the patient.  The risk of bleeding, infection, injury to surrounding structures, uterine perforation with risk of additional procedure laparoscopy/laparotomy were all reviewed discussed with patient.  Risk for blood clots was discussed.  Discussed risk for bleeding after the procedure anywhere from 7 to 10 days.  She will need a driver to and from surgery as well.   Pathology results will take about 2 weeks to return.

## 2023-09-28 ENCOUNTER — Telehealth: Payer: Self-pay | Admitting: Obstetrics and Gynecology

## 2023-09-28 DIAGNOSIS — N950 Postmenopausal bleeding: Secondary | ICD-10-CM

## 2023-09-28 NOTE — Telephone Encounter (Signed)
Surgery request 

## 2023-09-30 ENCOUNTER — Encounter: Payer: Self-pay | Admitting: *Deleted

## 2023-10-01 ENCOUNTER — Encounter (HOSPITAL_BASED_OUTPATIENT_CLINIC_OR_DEPARTMENT_OTHER): Payer: Self-pay | Admitting: Obstetrics and Gynecology

## 2023-10-01 NOTE — Progress Notes (Signed)
Spoke w/ via phone for pre-op interview---Yolanda Vaughn needs dos---- UPT per surgeon        Vaughn results------ COVID test -----patient states asymptomatic no test needed Arrive at -------0730 NPO after MN NO Solid Food.  Clear liquids from MN until---0630 Med rec completed Medications to take morning of surgery ----- Armour Thyroid and Prometrium Diabetic medication ----- Patient instructed no nail polish to be worn day of surgery Patient instructed to bring photo id and insurance card day of surgery Patient aware to have Driver (ride ) / caregiver    for 24 hours after surgery - Husband Yolanda Vaughn Patient Special Instructions ----- Pre-Op special Instructions ----- Patient verbalized understanding of instructions that were given at this phone interview. Patient denies chest pain, sob, fever, cough at the interview.

## 2023-10-09 ENCOUNTER — Ambulatory Visit (HOSPITAL_BASED_OUTPATIENT_CLINIC_OR_DEPARTMENT_OTHER): Payer: 59 | Admitting: Anesthesiology

## 2023-10-09 ENCOUNTER — Ambulatory Visit (HOSPITAL_BASED_OUTPATIENT_CLINIC_OR_DEPARTMENT_OTHER)
Admission: RE | Admit: 2023-10-09 | Discharge: 2023-10-09 | Disposition: A | Discharge status: Home / Self Care | Payer: 59 | Attending: Obstetrics and Gynecology | Admitting: Obstetrics and Gynecology

## 2023-10-09 ENCOUNTER — Other Ambulatory Visit: Payer: Self-pay

## 2023-10-09 ENCOUNTER — Encounter (HOSPITAL_BASED_OUTPATIENT_CLINIC_OR_DEPARTMENT_OTHER)
Admission: RE | Disposition: A | Discharge status: Home / Self Care | Payer: Self-pay | Source: Home / Self Care | Attending: Obstetrics and Gynecology

## 2023-10-09 ENCOUNTER — Encounter (HOSPITAL_BASED_OUTPATIENT_CLINIC_OR_DEPARTMENT_OTHER): Payer: Self-pay | Admitting: Obstetrics and Gynecology

## 2023-10-09 DIAGNOSIS — N848 Polyp of other parts of female genital tract: Secondary | ICD-10-CM

## 2023-10-09 DIAGNOSIS — Z6841 Body Mass Index (BMI) 40.0 and over, adult: Secondary | ICD-10-CM | POA: Diagnosis not present

## 2023-10-09 DIAGNOSIS — Z79890 Hormone replacement therapy (postmenopausal): Secondary | ICD-10-CM | POA: Diagnosis not present

## 2023-10-09 DIAGNOSIS — E66813 Obesity, class 3: Secondary | ICD-10-CM | POA: Diagnosis not present

## 2023-10-09 DIAGNOSIS — N950 Postmenopausal bleeding: Secondary | ICD-10-CM | POA: Diagnosis present

## 2023-10-09 DIAGNOSIS — E039 Hypothyroidism, unspecified: Secondary | ICD-10-CM | POA: Diagnosis not present

## 2023-10-09 DIAGNOSIS — Z01818 Encounter for other preprocedural examination: Secondary | ICD-10-CM

## 2023-10-09 HISTORY — PX: DILATATION & CURETTAGE/HYSTEROSCOPY WITH MYOSURE: SHX6511

## 2023-10-09 HISTORY — DX: Other specified postprocedural states: Z98.890

## 2023-10-09 HISTORY — DX: Hypothyroidism, unspecified: E03.9

## 2023-10-09 SURGERY — DILATATION & CURETTAGE/HYSTEROSCOPY WITH MYOSURE
Anesthesia: General | Site: Vagina

## 2023-10-09 MED ORDER — SODIUM CHLORIDE 0.9 % IR SOLN
Status: DC | PRN
  Administered 2023-10-09: 3000 mL

## 2023-10-09 MED ORDER — DEXAMETHASONE SODIUM PHOSPHATE 10 MG/ML IJ SOLN
INTRAMUSCULAR | Status: DC | PRN
  Administered 2023-10-09 (×2): 5 mg via INTRAVENOUS

## 2023-10-09 MED ORDER — LIDOCAINE HCL (PF) 2 % IJ SOLN
INTRAMUSCULAR | Status: AC
  Filled 2023-10-09: qty 5

## 2023-10-09 MED ORDER — LIDOCAINE 2% (20 MG/ML) 5 ML SYRINGE
INTRAMUSCULAR | Status: DC | PRN
  Administered 2023-10-09: 60 mg via INTRAVENOUS

## 2023-10-09 MED ORDER — LACTATED RINGERS IV SOLN: INTRAVENOUS | Status: DC

## 2023-10-09 MED ORDER — FENTANYL CITRATE (PF) 100 MCG/2ML IJ SOLN
25.0000 ug | INTRAMUSCULAR | Status: DC | PRN
  Administered 2023-10-09: 25 ug via INTRAVENOUS

## 2023-10-09 MED ORDER — FENTANYL CITRATE (PF) 100 MCG/2ML IJ SOLN
INTRAMUSCULAR | Status: AC
  Filled 2023-10-09: qty 2

## 2023-10-09 MED ORDER — SCOPOLAMINE 1 MG/3DAYS TD PT72
MEDICATED_PATCH | TRANSDERMAL | Status: AC
  Filled 2023-10-09: qty 1

## 2023-10-09 MED ORDER — OXYCODONE HCL 5 MG PO TABS: 5.0000 mg | ORAL_TABLET | Freq: Once | ORAL | Status: DC | PRN

## 2023-10-09 MED ORDER — ONDANSETRON HCL 4 MG/2ML IJ SOLN
INTRAMUSCULAR | Status: AC
  Filled 2023-10-09: qty 2

## 2023-10-09 MED ORDER — MIDAZOLAM HCL 2 MG/2ML IJ SOLN
INTRAMUSCULAR | Status: DC | PRN
  Administered 2023-10-09: 2 mg via INTRAVENOUS

## 2023-10-09 MED ORDER — ONDANSETRON HCL 4 MG/2ML IJ SOLN
INTRAMUSCULAR | Status: DC | PRN
  Administered 2023-10-09: 4 mg via INTRAVENOUS

## 2023-10-09 MED ORDER — SCOPOLAMINE 1 MG/3DAYS TD PT72
MEDICATED_PATCH | TRANSDERMAL | Status: DC | PRN
  Administered 2023-10-09 (×2): 1 via TRANSDERMAL

## 2023-10-09 MED ORDER — PROPOFOL 10 MG/ML IV BOLUS
INTRAVENOUS | Status: AC
  Filled 2023-10-09: qty 20

## 2023-10-09 MED ORDER — AMISULPRIDE (ANTIEMETIC) 5 MG/2ML IV SOLN
10.0000 mg | Freq: Once | INTRAVENOUS | Status: DC | PRN
Start: 2023-10-09 — End: 2023-10-09

## 2023-10-09 MED ORDER — POVIDONE-IODINE 10 % EX SWAB: 2.0000 | Freq: Once | CUTANEOUS | Status: DC

## 2023-10-09 MED ORDER — MONSELS FERRIC SUBSULFATE EX SOLN
CUTANEOUS | Status: AC
  Filled 2023-10-09: qty 8

## 2023-10-09 MED ORDER — LACTATED RINGERS IV SOLN: INTRAVENOUS | Status: DC | PRN

## 2023-10-09 MED ORDER — OXYCODONE HCL 5 MG/5ML PO SOLN: 5.0000 mg | Freq: Once | ORAL | Status: DC | PRN

## 2023-10-09 MED ORDER — FENTANYL CITRATE (PF) 100 MCG/2ML IJ SOLN
INTRAMUSCULAR | Status: DC | PRN
  Administered 2023-10-09 (×3): 50 ug via INTRAVENOUS

## 2023-10-09 MED ORDER — SILVER NITRATE-POT NITRATE 75-25 % EX MISC
CUTANEOUS | Status: AC
  Filled 2023-10-09: qty 10

## 2023-10-09 MED ORDER — ACETAMINOPHEN 500 MG PO TABS
ORAL_TABLET | ORAL | Status: AC
  Filled 2023-10-09: qty 2

## 2023-10-09 MED ORDER — LACTATED RINGERS IV SOLN
INTRAVENOUS | Status: DC
Start: 2023-10-09 — End: 2023-10-09

## 2023-10-09 MED ORDER — DEXAMETHASONE SODIUM PHOSPHATE 10 MG/ML IJ SOLN
INTRAMUSCULAR | Status: AC
  Filled 2023-10-09: qty 1

## 2023-10-09 MED ORDER — KETOROLAC TROMETHAMINE 30 MG/ML IJ SOLN
INTRAMUSCULAR | Status: DC | PRN
  Administered 2023-10-09: 30 mg via INTRAVENOUS

## 2023-10-09 MED ORDER — PROPOFOL 10 MG/ML IV BOLUS
INTRAVENOUS | Status: DC | PRN
  Administered 2023-10-09: 50 ug/kg/min via INTRAVENOUS
  Administered 2023-10-09: 200 mg via INTRAVENOUS

## 2023-10-09 MED ORDER — SODIUM CHLORIDE 0.9 % IV SOLN: INTRAVENOUS | Status: DC

## 2023-10-09 MED ORDER — KETOROLAC TROMETHAMINE 30 MG/ML IJ SOLN
30.0000 mg | Freq: Once | INTRAMUSCULAR | Status: DC | PRN
Start: 2023-10-09 — End: 2023-10-09

## 2023-10-09 MED ORDER — KETOROLAC TROMETHAMINE 30 MG/ML IJ SOLN
INTRAMUSCULAR | Status: AC
  Filled 2023-10-09: qty 1

## 2023-10-09 MED ORDER — ACETAMINOPHEN 500 MG PO TABS
1000.0000 mg | ORAL_TABLET | ORAL | Status: AC
  Administered 2023-10-09: 1000 mg via ORAL

## 2023-10-09 MED ORDER — MONSELS FERRIC SUBSULFATE EX SOLN
CUTANEOUS | Status: DC | PRN
  Administered 2023-10-09: 1 via TOPICAL

## 2023-10-09 MED ORDER — WHITE PETROLATUM EX OINT
TOPICAL_OINTMENT | CUTANEOUS | Status: AC
  Filled 2023-10-09: qty 5

## 2023-10-09 MED ORDER — MIDAZOLAM HCL 2 MG/2ML IJ SOLN
INTRAMUSCULAR | Status: AC
Start: 2023-10-09 — End: ?
  Filled 2023-10-09: qty 2

## 2023-10-09 SURGICAL SUPPLY — 19 items
CATH ROBINSON RED A/P 16FR (CATHETERS) IMPLANT
DEVICE MYOSURE LITE (MISCELLANEOUS) IMPLANT
DEVICE MYOSURE REACH (MISCELLANEOUS) IMPLANT
DILATOR CANAL MILEX (MISCELLANEOUS) IMPLANT
GAUZE 4X4 16PLY ~~LOC~~+RFID DBL (SPONGE) IMPLANT
GLOVE NEODERM STER SZ 7 (GLOVE) ×1 IMPLANT
GOWN STRL REUS W/ TWL LRG LVL3 (GOWN DISPOSABLE) ×1 IMPLANT
IV NS IRRIG 3000ML ARTHROMATIC (IV SOLUTION) ×1 IMPLANT
KIT PROCEDURE FLUENT (KITS) ×1 IMPLANT
KIT TURNOVER CYSTO (KITS) ×1 IMPLANT
PACK VAGINAL MINOR WOMEN LF (CUSTOM PROCEDURE TRAY) ×1 IMPLANT
PAD OB MATERNITY 4.3X12.25 (PERSONAL CARE ITEMS) ×1 IMPLANT
PAD PREP 24X48 CUFFED NSTRL (MISCELLANEOUS) ×1 IMPLANT
SCOPETTES 8 STERILE (MISCELLANEOUS) ×1 IMPLANT
SEAL CERVICAL OMNI LOK (ABLATOR) IMPLANT
SEAL ROD LENS SCOPE MYOSURE (ABLATOR) ×1 IMPLANT
SLEEVE SCD COMPRESS KNEE MED (STOCKING) ×1 IMPLANT
TOWEL OR 17X24 6PK STRL BLUE (TOWEL DISPOSABLE) ×1 IMPLANT
UNDERPAD 30X36 HEAVY ABSORB (UNDERPADS AND DIAPERS) ×1 IMPLANT

## 2023-10-09 NOTE — Anesthesia Procedure Notes (Signed)
Procedure Name: Intubation Date/Time: 10/09/2023 7:41 AM  Performed by: Francie Massing, CRNAPre-anesthesia Checklist: Patient identified, Emergency Drugs available, Suction available and Patient being monitored Patient Re-evaluated:Patient Re-evaluated prior to induction Oxygen Delivery Method: Circle system utilized Preoxygenation: Pre-oxygenation with 100% oxygen Induction Type: IV induction Ventilation: Mask ventilation without difficulty LMA Size: 4.0 Tube type: Oral Number of attempts: 1 Airway Equipment and Method: Stylet and Oral airway Placement Confirmation: ETT inserted through vocal cords under direct vision, positive ETCO2 and breath sounds checked- equal and bilateral Tube secured with: Tape Dental Injury: Teeth and Oropharynx as per pre-operative assessment

## 2023-10-09 NOTE — Discharge Instructions (Signed)
   No acetaminophen/Tylenol until after 12:17 pm today if needed.  No ibuprofen, Advil, Aleve, Motrin, ketorolac, meloxicam, naproxen, or other NSAIDS until after 2:09 pm today if needed.     Post Anesthesia Home Care Instructions  Activity: Get plenty of rest for the remainder of the day. A responsible individual must stay with you for 24 hours following the procedure.  For the next 24 hours, DO NOT: -Drive a car -Advertising copywriter -Drink alcoholic beverages -Take any medication unless instructed by your physician -Make any legal decisions or sign important papers.  Meals: Start with liquid foods such as gelatin or soup. Progress to regular foods as tolerated. Avoid greasy, spicy, heavy foods. If nausea and/or vomiting occur, drink only clear liquids until the nausea and/or vomiting subsides. Call your physician if vomiting continues.  Special Instructions/Symptoms: Your throat may feel dry or sore from the anesthesia or the breathing tube placed in your throat during surgery. If this causes discomfort, gargle with warm salt water. The discomfort should disappear within 24 hours.

## 2023-10-09 NOTE — Interval H&P Note (Signed)
History and Physical Interval Note:  10/09/2023 7:22 AM  Yolanda Vaughn  has presented today for surgery, with the diagnosis of postmenopausal bleeding, endocervical polyp.  The various methods of treatment have been discussed with the patient and family. After consideration of risks, benefits and other options for treatment, the patient has consented to  Procedure(s) with comments: DILATATION & CURETTAGE/HYSTEROSCOPY WITH MYOSURE with ECC (N/A) - LOCAL STANDBY as a surgical intervention.  The patient's history has been reviewed, patient examined, no change in status, stable for surgery.  I have reviewed the patient's chart and labs.  Questions were answered to the patient's satisfaction.     Earley Favor

## 2023-10-09 NOTE — Op Note (Signed)
10/09/2023 Yolanda Vaughn [id number]    OPERATIVE REPORT  Preop Diagnosis: possible endocervical polyp in menopause seen on [place]  Post operative diagnosis: same  Procedure: Myosure hysteroscopy Dilation and curettage, endocervical curettage  Surgeon: Dr. Allayne Butcher Assistant: none  Fluids: please see anesthesia report Fluid deficit: 130cc  Complications: None Anesthesia: MAC   Findings: Small appearing cervix and uterine cavity measuring 7cm with both ostia seen.  A likely small endocervical polyp was seen and removed.  No submucosal fibroids were seen.  Estimated blood loss: Minimal  Specimens: Endometrial curettings, ECC  Disposition of specimen: Pathology    Procedure: Patient was taken to the OR where she was placed in dorsal lithotomy in Allen stirrups. SCDs were in place.  The patient was prepped and draped in the usual sterile fashion. An adequate timeout was obtained and everyone agreed. A red rubber catheter is used to drain her bladder. A bivalve speculum was placed inside the vagina and the cervix visualized. The cervix was grasped anteriorly with a single-tooth tenaculum.  The uterus sounded to 7 cm. Sequential dilation was done to a #8 dilator, and the myosure hysteroscope was introduced into the uterine cavity. The cervix and endometrial lining appeared normal both ostia were seen there was no deformity of the cavity. A seal test was done and was passed. A small polyp was seen and removed with the myosure light.  This was also used to sample the entire cavity.  A sharp curettage was then performed to ensure complete sampling of the cavity. and was sent to pathology. All instrument, sponge and lap counts were correct x2. The patient was awakened taken to recovery room in stable condition. Earley Favor MD 10/09/2023 9:32 AM

## 2023-10-09 NOTE — Anesthesia Postprocedure Evaluation (Signed)
Anesthesia Post Note  Patient: Yolanda Vaughn  Procedure(s) Performed: DILATATION & CURETTAGE/HYSTEROSCOPY WITH MYOSURE with ECC (Vagina )     Patient location during evaluation: PACU Anesthesia Type: General Level of consciousness: awake Pain management: pain level controlled Vital Signs Assessment: post-procedure vital signs reviewed and stable Respiratory status: spontaneous breathing, nonlabored ventilation and respiratory function stable Cardiovascular status: blood pressure returned to baseline and stable Postop Assessment: no apparent nausea or vomiting Anesthetic complications: no   No notable events documented.  Last Vitals:  Vitals:   10/09/23 0915 10/09/23 0950  BP: (!) 161/80 (!) 153/80  Pulse: 60 67  Resp: 18 20  Temp:    SpO2: 90% 96%    Last Pain:  Vitals:   10/09/23 0950  TempSrc:   PainSc: 1                  Virna Livengood P Raneem [place]

## 2023-10-09 NOTE — Anesthesia Preprocedure Evaluation (Addendum)
Anesthesia Evaluation  Patient identified by MRN, date of birth, ID band Patient awake    Reviewed: Allergy & Precautions, NPO status , Patient's Chart, lab work & pertinent test results  History of Anesthesia Complications (+) PONV and history of anesthetic complications  Airway Mallampati: II  TM Distance: >3 FB Neck ROM: Full    Dental no notable dental hx.    Pulmonary neg pulmonary ROS   Pulmonary exam normal        Cardiovascular negative cardio ROS Normal cardiovascular exam     Neuro/Psych negative neurological ROS  negative psych ROS   GI/Hepatic negative GI ROS, Neg liver ROS,,,  Endo/Other  Hypothyroidism  Class 3 obesity  Renal/GU negative Renal ROS     Musculoskeletal  (+) Arthritis ,    Abdominal  (+) + obese  Peds  Hematology negative hematology ROS (+)   Anesthesia Other Findings postmenopausal bleeding endocervical poly  Reproductive/Obstetrics                             Anesthesia Physical Anesthesia Plan  ASA: 3  Anesthesia Plan: General   Post-op Pain Management:    Induction: Intravenous  PONV Risk Score and Plan: 4 or greater and Ondansetron, Dexamethasone, Propofol infusion, Midazolam, Scopolamine patch - Pre-op and Treatment may vary due to age or medical condition  Airway Management Planned: LMA  Additional Equipment:   Intra-op Plan:   Post-operative Plan: Extubation in OR  Informed Consent: I have reviewed the patients History and Physical, chart, labs and discussed the procedure including the risks, benefits and alternatives for the proposed anesthesia with the patient or authorized representative who has indicated his/her understanding and acceptance.     Dental advisory given  Plan Discussed with: CRNA  Anesthesia Plan Comments:        Anesthesia Quick Evaluation

## 2023-10-09 NOTE — Transfer of Care (Signed)
Immediate Anesthesia Transfer of Care Note  Patient: Yolanda Vaughn  Procedure(s) Performed: Procedure(s) (LRB): DILATATION & CURETTAGE/HYSTEROSCOPY WITH MYOSURE with ECC (N/A)  Patient Location: PACU  Anesthesia Type: General  Level of Consciousness: awake, oriented, sedated and patient cooperative  Airway & Oxygen Therapy: Patient Spontanous Breathing and Patient connected to face mask oxygen  Post-op Assessment: Report given to PACU RN and Post -op Vital signs reviewed and stable  Post vital signs: Reviewed and stable  Complications: No apparent anesthesia complications Last Vitals:  Vitals Value Taken Time  BP    Temp    Pulse 64 10/09/23 0825  Resp 14 10/09/23 0825  SpO2 97 % 10/09/23 0825  Vitals shown include unfiled device data.  Last Pain:  Vitals:   10/09/23 0544  TempSrc: Oral  PainSc: 0-No pain      Patients Stated Pain Goal: 4 (10/09/23 0544)  Complications: No notable events documented.

## 2023-10-12 LAB — SURGICAL PATHOLOGY

## 2023-10-13 ENCOUNTER — Encounter (HOSPITAL_BASED_OUTPATIENT_CLINIC_OR_DEPARTMENT_OTHER): Payer: Self-pay | Admitting: Obstetrics and Gynecology

## 2023-10-26 ENCOUNTER — Ambulatory Visit (INDEPENDENT_AMBULATORY_CARE_PROVIDER_SITE_OTHER): Payer: 59 | Admitting: Obstetrics and Gynecology

## 2023-10-26 ENCOUNTER — Encounter: Payer: Self-pay | Admitting: Obstetrics and Gynecology

## 2023-10-26 ENCOUNTER — Encounter: Payer: 59 | Admitting: Obstetrics and Gynecology

## 2023-10-26 VITALS — BP 118/80 | HR 86

## 2023-10-26 DIAGNOSIS — Z09 Encounter for follow-up examination after completed treatment for conditions other than malignant neoplasm: Secondary | ICD-10-CM

## 2023-10-26 MED ORDER — FLUCONAZOLE 150 MG PO TABS
150.0000 mg | ORAL_TABLET | Freq: Once | ORAL | 0 refills | Status: AC
Start: 2023-10-26 — End: 2023-10-26

## 2023-10-26 NOTE — Progress Notes (Signed)
   Acute Office Visit  Subjective:    Patient ID: Yolanda Vaughn, female    DOB: 03/21/1961, 62 y.o.   MRN: [id number]   HPI 62 y.o. presents today for Post-op Follow-up (Post op//jj/) . From [facility] hysteroscopy D&C with removal of endometrial polyp Doing well with no concerns Patient's last menstrual period was 02/16/2015 (approximate).   Pathology benign  Review of Systems  SURGICAL PATHOLOGY CASE: [id number] PATIENT: Yolanda Vaughn Surgical Pathology Report     Clinical History: Postmenopausal bleeding, endocervical polyp (las)     FINAL MICROSCOPIC DIAGNOSIS:  A. ENDOCERVICAL, CURETTAGE: - Benign cervical glandular and squamous mucosa - Negative for dysplasia or malignancy  B. ENDOMETRIAL, CURETTAGE: - Fragment suggestive of a benign endometrial polyp - Nonpolypoid benign inactive endometrium - Benign smooth muscle - Negative for hyperplasia or malignancy        Objective:    OBGyn Exam  BP 118/80   Pulse 86   LMP 02/16/2015 (Approximate)   SpO2 98%  Wt Readings from Last 3 Encounters:  10/09/23 269 lb 9.6 oz (122.3 kg)  09/17/23 273 lb (123.8 kg)  05/01/23 263 lb (119.3 kg)        Patient informed chaperone available to be present for breast and/or pelvic exam. Patient has requested no chaperone to be present. Patient has been advised what will be completed during breast and pelvic exam.   Assessment & Plan:  RTC with any persistent PM bleeding or any concerns and with annual exams Diflucan sent for what feels like a yeast infection   Earley Favor

## 2024-02-17 ENCOUNTER — Encounter: Payer: Self-pay | Admitting: Obstetrics and Gynecology

## 2024-05-02 ENCOUNTER — Telehealth: Payer: Self-pay | Admitting: Obstetrics and Gynecology

## 2024-05-02 ENCOUNTER — Ambulatory Visit: Payer: 59 | Admitting: Obstetrics and Gynecology

## 2024-05-02 DIAGNOSIS — E2839 Other primary ovarian failure: Secondary | ICD-10-CM

## 2024-05-02 NOTE — Telephone Encounter (Signed)
Bone scan

## 2024-05-03 ENCOUNTER — Ambulatory Visit (INDEPENDENT_AMBULATORY_CARE_PROVIDER_SITE_OTHER): Admitting: Obstetrics and Gynecology

## 2024-05-03 ENCOUNTER — Encounter: Payer: Self-pay | Admitting: Obstetrics and Gynecology

## 2024-05-03 ENCOUNTER — Other Ambulatory Visit (HOSPITAL_COMMUNITY)
Admission: RE | Admit: 2024-05-03 | Discharge: 2024-05-03 | Disposition: A | Discharge status: Home / Self Care | Source: Ambulatory Visit | Attending: Obstetrics and Gynecology | Admitting: Obstetrics and Gynecology

## 2024-05-03 VITALS — BP 112/76 | HR 73 | Ht 66.25 in | Wt 273.0 lb

## 2024-05-03 DIAGNOSIS — Z01419 Encounter for gynecological examination (general) (routine) without abnormal findings: Secondary | ICD-10-CM | POA: Diagnosis present

## 2024-05-03 DIAGNOSIS — E2839 Other primary ovarian failure: Secondary | ICD-10-CM

## 2024-05-03 DIAGNOSIS — Z1331 Encounter for screening for depression: Secondary | ICD-10-CM | POA: Diagnosis not present

## 2024-05-03 DIAGNOSIS — Z1211 Encounter for screening for malignant neoplasm of colon: Secondary | ICD-10-CM

## 2024-05-03 NOTE — Addendum Note (Signed)
 Addended by: Reinaldo Caras on: 05/03/2024 08:41 AM   Modules accepted: Orders

## 2024-05-03 NOTE — Progress Notes (Signed)
 63 y.o. y.o. female here for annual exam. Patient's last menstrual period was 02/16/2015 (approximate).     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 On progesterone.  Understands she needs progesterone as long as she has a uterus to prevent unopposed estrogen on the endometrial lining   US  PELVIS TRANSVAGINAL NON-OB (TV ONLY) (Accession [id number]) (Order [id number]) Imaging Date: 05/26/2023 Department: [facility] Released By: Andris Keels, CMA Authorizing: Wanita Gutta, MD   Exam Status  Status  Final [99]   PACS Intelerad Image Link   Show images for US  PELVIS TRANSVAGINAL NON-OB (TV ONLY) Study Result  Narrative & Impression  Indication: PMB on HRT   Vaginal u/s   Anteverted uterus normal size and shape No myometrial masses Endometrial thickness 2.63mm   6mm echogenic mass with feeder vessel protrudes into cervical canalc/w cervical polyp   Both ovaries atrophic No adnexal masses or free fluid seen   Impression: cervical polyp     She is on HRT, testosterone and thyroid medication through [facility]. They are monitoring her levels. They increased her estrogen patch last month and she had some spotting.  No significant vasomotor symptoms. Sexually active, no pain.   Patient's last menstrual period was 02/16/2015 (approximate).          Sexually active: Yes.    The current method of family planning is post menopausal status.    Exercising: No.   Smoker:  no  Health Maintenance: Pap:  04/19/20 neg: HR HPV neg, 04/15/19 neg: HR HPV neg, pap normal 2024 History of abnormal Pap:  yes, colpo and LEEP in 2000 MMG:  02/15/24 Breast Density Cat B, BI-RADS CAT 1 neg BMD:   baseline placed. Risks with hashimotos. Has fractures  in toes no family history of osteoporosis on Vit D Colonoscopy: 08/17/2013 f/u 10 years, she will schedule a f/u. States she is aware but hesitant to schedule in [place]. Counseled on importance TDaP:  04/15/19 Gardasil: n/a Body mass index is 43.73 kg/m.     05/03/2024    8:13 AM  Depression screen PHQ 2/9  Decreased Interest 0  Down, Depressed, Hopeless 0  PHQ - 2 Score 0    Blood pressure 112/76, pulse 73, height 5' 6.25 (1.683 m), weight 273 lb (123.8 kg), last menstrual period 02/16/2015, SpO2 99%.     Component Value Date/Time   DIAGPAP  05/01/2023 0833    - Negative for intraepithelial lesion or malignancy (NILM)   DIAGPAP  04/19/2020 1013    - Negative for intraepithelial lesion or malignancy (NILM)   DIAGPAP  04/15/2019 0000    NEGATIVE FOR INTRAEPITHELIAL LESIONS OR MALIGNANCY.   HPVHIGH Negative 05/01/2023 0833   HPVHIGH Negative 04/19/2020 1013   ADEQPAP  05/01/2023 0833    Satisfactory for evaluation; transformation zone component PRESENT.   ADEQPAP  04/19/2020 1013    Satisfactory for evaluation; transformation zone component ABSENT.   ADEQPAP  04/15/2019 0000    Satisfactory for evaluation  endocervical/transformation zone component PRESENT.    GYN HISTORY:    Component Value Date/Time   DIAGPAP  05/01/2023 0833    - Negative for intraepithelial lesion or malignancy (NILM)   DIAGPAP  04/19/2020 1013    - Negative for intraepithelial lesion or malignancy (NILM)  DIAGPAP  04/15/2019 0000    NEGATIVE FOR INTRAEPITHELIAL LESIONS OR MALIGNANCY.   HPVHIGH Negative 05/01/2023 0833   HPVHIGH Negative 04/19/2020 1013   ADEQPAP  05/01/2023 0833    Satisfactory for evaluation; transformation zone component PRESENT.   ADEQPAP  04/19/2020 1013    Satisfactory for evaluation; transformation zone component ABSENT.   ADEQPAP  04/15/2019 0000    Satisfactory for evaluation  endocervical/transformation zone component PRESENT.    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   Fibrocystic breast 10/18/1991   Hashimoto's disease 11/17/2013   Hypothyroidism    PONV (postoperative nausea and vomiting)     Past Surgical History:  Procedure Laterality Date   CERVICAL BIOPSY  W/ LOOP ELECTRODE EXCISION  01/16/1999   COLPOSCOPY  12/18/1998   DILATATION & CURETTAGE/HYSTEROSCOPY WITH MYOSURE N/A 10/09/2023   Procedure: DILATATION & CURETTAGE/HYSTEROSCOPY WITH MYOSURE with ECC;  Surgeon: Reinaldo Caras, MD;  Location: [facility];  Service: Gynecology;  Laterality: N/A;  LOCAL STANDBY   KNEE ARTHROSCOPY Right 12/18/2006   KNEE ARTHROSCOPY Right 05/17/2010   KNEE RECONSTRUCTION Right 11/17/1994   REPAIR PERONEAL TENDONS ANKLE Left 06/17/2009   SHOULDER ARTHROSCOPY Right 11/17/2000   TONSILLECTOMY AND ADENOIDECTOMY  11/17/1966   TOTAL KNEE ARTHROPLASTY Right 03/2022    Current Outpatient Medications on File Prior to Visit  Medication Sig Dispense Refill   ARMOUR THYROID 90 MG tablet Take by mouth.     DHEA 25 MG CAPS Take 1 capsule by mouth 2 (two) times a week.     estradiol  (VIVELLE -DOT) 0.1 MG/24HR patch Place 1 patch onto the skin 2 (two) times a week.     Ginger, Zingiber officinalis, (GINGER ROOT) 550 MG CAPS Take by mouth.     magnesium 30 MG tablet Take 30 mg by mouth at bedtime as needed (Sleep).     Methylcobalamin (METHYL B-12 PO) Take 1,000 mcg by mouth once a week.      Multiple Vitamin (MULTIVITAMIN PO) Take by mouth.     Multiple Vitamins-Minerals (ZINC PO) Take 50 mg by mouth. Solaray Biocitrate Zince.     Nutritional Supplements (NUTRITIONAL SUPPLEMENT PO) Take 400 mg by mouth. Turmeric     Probiotic Product (PROBIOTIC DAILY PO) Take by mouth  daily.     progesterone (PROMETRIUM) 100 MG capsule Take by mouth.     UNABLE TO FIND Med Name: testosterone pill prn     VITAMIN D-VITAMIN K PO Take by mouth. 7500mg      No current facility-administered medications on file prior to visit.    Social History   Socioeconomic History   Marital status: Married    Spouse name: Not on file   Number of children: Not on file   Years of education: Not on file   Highest education level: Not on file  Occupational History   Not on file  Tobacco Use   Smoking status: Never   Smokeless tobacco: Never  Vaping Use   Vaping status: Never Used  Substance and Sexual Activity   Alcohol use: No   Drug use: No   Sexual activity: Yes    Partners: Male    Birth control/protection: Post-menopausal  Other Topics Concern   Not on file  Social History Narrative   Not on file   Social Drivers of Health   Financial Resource Strain: Not on file  Food Insecurity: Not on file  Transportation Needs: Not on file  Physical Activity: Not on file  Stress: Not on file  Social Connections: Not on file  Intimate Partner Violence: Not on file    Family History  Problem Relation Age of Onset   Osteoporosis Mother    Fibroids Mother    Hypertension Mother    Colitis Mother    Hyperlipidemia Mother    Thyroid disease Mother    COPD Mother    Heart failure Mother    Fibroids Sister    Dementia Father    Thyroid disease Brother    Hyperlipidemia Brother    Osteoporosis Maternal Grandmother    Emphysema Maternal Grandfather    Pulmonary disease Maternal Grandfather      Allergies  Allergen Reactions   Cherry Hives   Codeine Hives and Other (See Comments)   Gluten Meal Other (See Comments)    Due to hashimoto's   Milk-Related Compounds     Due to hashimoto's   Other     Other reaction(s): Unknown   Papaya Derivatives Hives   Plum Pulp Hives   Pork-Derived Products    Shellfish-Derived Products    Sulfamethoxazole Other (See Comments)     Hives   Sulfonamide Derivatives Other (See Comments)   Talwin [Pentazocine] Palpitations      Patient's last menstrual period was Patient's last menstrual period was 02/16/2015 (approximate)..             Review of Systems Alls systems reviewed and are negative.     Physical Exam Constitutional:      Appearance: Normal appearance.  Genitourinary:     Vulva normal.     No lesions in the vagina.     Right Labia: No rash, lesions or skin changes.    Left Labia: No lesions, skin changes or rash.    Vaginal cuff intact.    No vaginal discharge or tenderness.     No vaginal prolapse present.    No vaginal atrophy present.     Right Adnexa: not tender and no mass present.    Left Adnexa: not tender and no mass present.    No cervical discharge or lesion.     Uterus is not enlarged, tender or irregular.     Uterus is not absent. Breasts:    Right: Normal.     Left: Normal.  HENT:     Head: Normocephalic.  Neck:     Thyroid: No thyroid mass, thyromegaly or thyroid tenderness.   Cardiovascular:     Rate and Rhythm: Normal rate and regular rhythm.     Heart sounds: Normal heart sounds, S1 normal and S2 normal.  Pulmonary:     Effort: Pulmonary effort is normal.     Breath sounds: Normal breath sounds and air entry.  Abdominal:     General: There is no distension.     Palpations: Abdomen is soft. There is no mass.     Tenderness: There is no abdominal tenderness. There is no guarding or rebound.   Musculoskeletal:        General: Normal range of motion.     Cervical back: Full passive range of motion without pain, normal range of motion and neck supple. No tenderness.     Right lower leg: No edema.  Left lower leg: No edema.   Neurological:     Mental Status: She is alert.   Skin:    General: Skin is warm.   Psychiatric:        Mood and Affect: Mood normal.        Behavior: Behavior normal.        Thought Content: Thought content normal.  Vitals and nursing  note reviewed. Exam conducted with a chaperone present.       A:         Well Woman GYN exam                             P:        Pap smear collected today Encouraged annual mammogram screening Colon cancer screening referral placed today DXA ordered today Labs and immunizations to do with PMD Discussed breast self exams Encouraged healthy lifestyle practices Encouraged Vit D and Calcium   No follow-ups on file.  Reinaldo Caras

## 2024-05-05 ENCOUNTER — Ambulatory Visit: Payer: Self-pay | Admitting: Obstetrics and Gynecology

## 2024-05-05 LAB — CYTOLOGY - PAP
Adequacy: ABSENT
Diagnosis: NEGATIVE

## 2025-05-04 ENCOUNTER — Ambulatory Visit: Admitting: Obstetrics and Gynecology
